# Patient Record
Sex: Female | Born: 1935 | Race: White | Hispanic: No | Marital: Married | Smoking: Never smoker
Health system: Southern US, Community
[De-identification: ages and names within clinical notes are randomized; demographics above are authoritative.]

## PROBLEM LIST (undated history)

## (undated) DIAGNOSIS — I722 Aneurysm of renal artery: Secondary | ICD-10-CM

## (undated) DIAGNOSIS — E278 Other specified disorders of adrenal gland: Secondary | ICD-10-CM

## (undated) DIAGNOSIS — I4891 Unspecified atrial fibrillation: Secondary | ICD-10-CM

## (undated) DIAGNOSIS — I701 Atherosclerosis of renal artery: Secondary | ICD-10-CM

## (undated) DIAGNOSIS — N63 Unspecified lump in unspecified breast: Secondary | ICD-10-CM

## (undated) DIAGNOSIS — C50919 Malignant neoplasm of unspecified site of unspecified female breast: Secondary | ICD-10-CM

## (undated) DIAGNOSIS — E785 Hyperlipidemia, unspecified: Secondary | ICD-10-CM

## (undated) DIAGNOSIS — I10 Essential (primary) hypertension: Secondary | ICD-10-CM

## (undated) HISTORY — DX: Unspecified lump in unspecified breast: N63.0

## (undated) HISTORY — DX: Essential (primary) hypertension: I10

## (undated) HISTORY — DX: Other specified disorders of adrenal gland: E27.8

## (undated) HISTORY — DX: Unspecified atrial fibrillation: I48.91

## (undated) HISTORY — DX: Malignant neoplasm of unspecified site of unspecified female breast: C50.919

## (undated) HISTORY — PX: CATARACT EXTRACTION: SUR2

## (undated) HISTORY — PX: BREAST SURGERY: SHX581

## (undated) HISTORY — DX: Atherosclerosis of renal artery: I70.1

## (undated) HISTORY — DX: Hyperlipidemia, unspecified: E78.5

## (undated) HISTORY — DX: Aneurysm of renal artery: I72.2

---

## 2000-05-19 ENCOUNTER — Encounter: Payer: Self-pay | Admitting: Internal Medicine

## 2000-05-19 ENCOUNTER — Ambulatory Visit (HOSPITAL_COMMUNITY): Admission: RE | Admit: 2000-05-19 | Discharge: 2000-05-19 | Payer: Self-pay | Admitting: Internal Medicine

## 2010-08-05 ENCOUNTER — Other Ambulatory Visit: Payer: Self-pay | Admitting: Radiology

## 2010-08-05 DIAGNOSIS — C50912 Malignant neoplasm of unspecified site of left female breast: Secondary | ICD-10-CM

## 2010-08-10 ENCOUNTER — Ambulatory Visit
Admission: RE | Admit: 2010-08-10 | Discharge: 2010-08-10 | Disposition: A | Discharge status: Home / Self Care | Payer: Medicare Other | Source: Ambulatory Visit | Attending: Radiology | Admitting: Radiology

## 2010-08-10 DIAGNOSIS — C50912 Malignant neoplasm of unspecified site of left female breast: Secondary | ICD-10-CM

## 2010-08-10 MED ORDER — GADOBENATE DIMEGLUMINE 529 MG/ML IV SOLN
20.0000 mL | Freq: Once | INTRAVENOUS | Status: AC | PRN
  Administered 2010-08-10: 20 mL via INTRAVENOUS

## 2010-08-13 ENCOUNTER — Ambulatory Visit (HOSPITAL_BASED_OUTPATIENT_CLINIC_OR_DEPARTMENT_OTHER): Payer: Medicare Other | Admitting: Surgery

## 2010-08-13 ENCOUNTER — Encounter (HOSPITAL_BASED_OUTPATIENT_CLINIC_OR_DEPARTMENT_OTHER): Payer: Medicare Other | Admitting: Oncology

## 2010-08-13 ENCOUNTER — Other Ambulatory Visit: Payer: Self-pay | Admitting: Oncology

## 2010-08-13 ENCOUNTER — Encounter (INDEPENDENT_AMBULATORY_CARE_PROVIDER_SITE_OTHER): Payer: Self-pay | Admitting: Surgery

## 2010-08-13 VITALS — BP 152/83 | HR 80 | Temp 98.0°F | Resp 20

## 2010-08-13 DIAGNOSIS — C50319 Malignant neoplasm of lower-inner quadrant of unspecified female breast: Secondary | ICD-10-CM

## 2010-08-13 DIAGNOSIS — C50919 Malignant neoplasm of unspecified site of unspecified female breast: Secondary | ICD-10-CM

## 2010-08-13 LAB — CBC WITH DIFFERENTIAL/PLATELET
BASO%: 0.2 % (ref 0.0–2.0)
Basophils Absolute: 0 10*3/uL (ref 0.0–0.1)
EOS%: 4.1 % (ref 0.0–7.0)
Eosinophils Absolute: 0.3 10*3/uL (ref 0.0–0.5)
HCT: 38.2 % (ref 34.8–46.6)
HGB: 13.2 g/dL (ref 11.6–15.9)
LYMPH%: 19.9 % (ref 14.0–49.7)
MCH: 31.6 pg (ref 25.1–34.0)
MCHC: 34.6 g/dL (ref 31.5–36.0)
MCV: 91.3 fL (ref 79.5–101.0)
MONO#: 0.6 10*3/uL (ref 0.1–0.9)
MONO%: 6.8 % (ref 0.0–14.0)
NEUT#: 5.8 10*3/uL (ref 1.5–6.5)
NEUT%: 69 % (ref 38.4–76.8)
Platelets: 159 10*3/uL (ref 145–400)
RBC: 4.18 10*6/uL (ref 3.70–5.45)
RDW: 12 % (ref 11.2–14.5)
WBC: 8.4 10*3/uL (ref 3.9–10.3)
lymph#: 1.7 10*3/uL (ref 0.9–3.3)

## 2010-08-13 LAB — COMPREHENSIVE METABOLIC PANEL
ALT: 15 U/L (ref 0–35)
AST: 15 U/L (ref 0–37)
Albumin: 3.8 g/dL (ref 3.5–5.2)
Alkaline Phosphatase: 53 U/L (ref 39–117)
BUN: 16 mg/dL (ref 6–23)
CO2: 35 mEq/L — ABNORMAL HIGH (ref 19–32)
Calcium: 12.6 mg/dL — ABNORMAL HIGH (ref 8.4–10.5)
Chloride: 89 mEq/L — ABNORMAL LOW (ref 96–112)
Creatinine, Ser: 1.12 mg/dL — ABNORMAL HIGH (ref 0.50–1.10)
Glucose, Bld: 265 mg/dL — ABNORMAL HIGH (ref 70–99)
Potassium: 4.5 mEq/L (ref 3.5–5.3)
Sodium: 130 mEq/L — ABNORMAL LOW (ref 135–145)
Total Bilirubin: 0.5 mg/dL (ref 0.3–1.2)
Total Protein: 6.9 g/dL (ref 6.0–8.3)

## 2010-08-13 LAB — CANCER ANTIGEN 27.29: CA 27.29: 47 U/mL — ABNORMAL HIGH (ref 0–39)

## 2010-08-13 NOTE — Progress Notes (Signed)
Subjective:     Patient ID: Mary Doyle, female   DOB: Jan 21, 1932, 77 y.o.   MRN: [id number]    BP 152/83  Pulse 80  Temp 98 F (36.7 C)  Resp 20    HPI  The patient noticed a.all mass in her left breast about 8 months ago.  It is not painful.  She has no history of breast cancer in her family.  She denies fever or chills.  Mammogram showed a 4 cm lesion in the lower inner quadrant of the left breast.  The tumor is ER+ PR+ her 2 neu -.  Biopsy confirmed left breast cancer. Past Medical History  Diagnosis Date  . Diabetes mellitus   . Hypertension   . Cancer   . Hyperparathyroidism   . Hyperlipidemia   History reviewed. No pertinent past surgical history. No Known Allergies No current outpatient prescriptions on file.   History   Social History  . Marital Status: Married    Spouse Name: N/A    Number of Children: N/A  . Years of Education: N/A   Occupational History  . Not on file.   Social History Main Topics  . Smoking status: Never Smoker   . Smokeless tobacco: Not on file  . Alcohol Use: No  . Drug Use: No  . Sexually Active: Not on file   Other Topics Concern  . Not on file   Social History Narrative  . No narrative on file   Review of Systems  Constitutional: Negative.   HENT: Negative.   Eyes: Negative.   Respiratory: Negative.   Cardiovascular: Negative.   Gastrointestinal: Negative.   Genitourinary: Negative.   Musculoskeletal: Negative.   Neurological: Negative.   Hematological: Negative.   Psychiatric/Behavioral: Negative.        Objective:   Physical Exam  Constitutional: She is oriented to person, place, and time. She appears well-developed and well-nourished.  HENT:  Head: Normocephalic and atraumatic.  Eyes: EOM are normal. Pupils are equal, round, and reactive to light.  Neck: Normal range of motion. Neck supple.  Cardiovascular: Normal rate and regular rhythm.  Exam reveals no gallop and no friction rub.   No murmur  heard. Pulmonary/Chest: Effort normal and breath sounds normal.       Left breast with 4 cm tumor in left lower inner quadrant.  No left axillary adenopathy.  Abdominal: Bowel sounds are normal.  Musculoskeletal: Normal range of motion.  Neurological: She is alert and oriented to person, place, and time.  Skin: Skin is warm and dry.  Psychiatric: She has a normal mood and affect. Her behavior is normal. Judgment and thought content normal.       Assessment:  Left breast cancer 4 cm ER+ PR+ HER 2 NEU -.  Plan:  I had a lengthy discussion with the patient and daughter about options of treatment. I discussed breast conservation vs mastectomy and sentinel lymph node mapping.  The pros and cons of each were discussed. They would like to proceed with left simple mastectomy with sentinel lymph node mapping.The surgical and non surgical options have been discussed with the patient.  Risks of surgery include bleeding,  Infection,  Flap necrosis,  Tissue loss,  Chronic pain,  Numbness,  And the need for additional procedures.  Reconstruction options also have been discussed with the patient as well.  The patient agrees to proceed.I also dicussed the risk of shoulder pain and lymphedema with the patient as well.

## 2010-08-13 NOTE — Patient Instructions (Signed)
Office will call to schedule your surgery.  [phone].

## 2010-08-18 ENCOUNTER — Other Ambulatory Visit (INDEPENDENT_AMBULATORY_CARE_PROVIDER_SITE_OTHER): Payer: Self-pay | Admitting: Surgery

## 2010-08-18 DIAGNOSIS — C50912 Malignant neoplasm of unspecified site of left female breast: Secondary | ICD-10-CM

## 2010-09-04 ENCOUNTER — Other Ambulatory Visit (INDEPENDENT_AMBULATORY_CARE_PROVIDER_SITE_OTHER): Payer: Self-pay | Admitting: Surgery

## 2010-09-04 ENCOUNTER — Ambulatory Visit (HOSPITAL_COMMUNITY)
Admission: RE | Admit: 2010-09-04 | Discharge: 2010-09-04 | Disposition: A | Discharge status: Home / Self Care | Payer: Medicare Other | Source: Ambulatory Visit | Attending: Surgery | Admitting: Surgery

## 2010-09-04 DIAGNOSIS — C50912 Malignant neoplasm of unspecified site of left female breast: Secondary | ICD-10-CM

## 2010-09-04 DIAGNOSIS — Z01812 Encounter for preprocedural laboratory examination: Secondary | ICD-10-CM | POA: Insufficient documentation

## 2010-09-04 DIAGNOSIS — Z01818 Encounter for other preprocedural examination: Secondary | ICD-10-CM | POA: Insufficient documentation

## 2010-09-04 DIAGNOSIS — Z01810 Encounter for preprocedural cardiovascular examination: Secondary | ICD-10-CM | POA: Insufficient documentation

## 2010-09-04 LAB — CANCER ANTIGEN 27.29: CA 27.29: 54 U/mL — ABNORMAL HIGH (ref 0–39)

## 2010-09-04 LAB — COMPREHENSIVE METABOLIC PANEL
AST: 16 U/L (ref 0–37)
Albumin: 4 g/dL (ref 3.5–5.2)
Chloride: 91 mEq/L — ABNORMAL LOW (ref 96–112)
Creatinine, Ser: 0.83 mg/dL (ref 0.50–1.10)
Total Bilirubin: 0.5 mg/dL (ref 0.3–1.2)

## 2010-09-04 LAB — CBC
MCV: 89.1 fL (ref 78.0–100.0)
Platelets: 187 10*3/uL (ref 150–400)
RDW: 11.9 % (ref 11.5–15.5)
WBC: 8.5 10*3/uL (ref 4.0–10.5)

## 2010-09-04 LAB — DIFFERENTIAL
Basophils Absolute: 0 10*3/uL (ref 0.0–0.1)
Eosinophils Absolute: 0.7 10*3/uL (ref 0.0–0.7)
Eosinophils Relative: 8 % — ABNORMAL HIGH (ref 0–5)
Lymphocytes Relative: 30 % (ref 12–46)
Neutrophils Relative %: 52 % (ref 43–77)

## 2010-09-04 LAB — SURGICAL PCR SCREEN: Staphylococcus aureus: NEGATIVE

## 2010-09-05 ENCOUNTER — Inpatient Hospital Stay (HOSPITAL_COMMUNITY): Admission: RE | Admit: 2010-09-05 | Payer: Medicare Other | Source: Ambulatory Visit

## 2010-09-12 ENCOUNTER — Other Ambulatory Visit (INDEPENDENT_AMBULATORY_CARE_PROVIDER_SITE_OTHER): Payer: Self-pay | Admitting: Surgery

## 2010-09-12 ENCOUNTER — Ambulatory Visit (HOSPITAL_COMMUNITY)
Admission: RE | Admit: 2010-09-12 | Discharge: 2010-09-12 | Disposition: A | Discharge status: Home / Self Care | Payer: Medicare Other | Source: Ambulatory Visit | Attending: Surgery | Admitting: Surgery

## 2010-09-12 ENCOUNTER — Ambulatory Visit (HOSPITAL_COMMUNITY)
Admission: RE | Admit: 2010-09-12 | Discharge: 2010-09-13 | Disposition: A | Discharge status: Home / Self Care | Payer: Medicare Other | Source: Ambulatory Visit | Attending: Surgery | Admitting: Surgery

## 2010-09-12 DIAGNOSIS — I10 Essential (primary) hypertension: Secondary | ICD-10-CM | POA: Insufficient documentation

## 2010-09-12 DIAGNOSIS — C50919 Malignant neoplasm of unspecified site of unspecified female breast: Secondary | ICD-10-CM

## 2010-09-12 DIAGNOSIS — Z01818 Encounter for other preprocedural examination: Secondary | ICD-10-CM | POA: Insufficient documentation

## 2010-09-12 DIAGNOSIS — Z01810 Encounter for preprocedural cardiovascular examination: Secondary | ICD-10-CM | POA: Insufficient documentation

## 2010-09-12 DIAGNOSIS — E119 Type 2 diabetes mellitus without complications: Secondary | ICD-10-CM | POA: Insufficient documentation

## 2010-09-12 DIAGNOSIS — C50912 Malignant neoplasm of unspecified site of left female breast: Secondary | ICD-10-CM

## 2010-09-12 DIAGNOSIS — M129 Arthropathy, unspecified: Secondary | ICD-10-CM | POA: Insufficient documentation

## 2010-09-12 DIAGNOSIS — E785 Hyperlipidemia, unspecified: Secondary | ICD-10-CM | POA: Insufficient documentation

## 2010-09-12 DIAGNOSIS — Z01812 Encounter for preprocedural laboratory examination: Secondary | ICD-10-CM | POA: Insufficient documentation

## 2010-09-12 LAB — GLUCOSE, CAPILLARY
Glucose-Capillary: 154 mg/dL — ABNORMAL HIGH (ref 70–99)
Glucose-Capillary: 157 mg/dL — ABNORMAL HIGH (ref 70–99)

## 2010-09-12 MED ORDER — TECHNETIUM TC 99M SULFUR COLLOID FILTERED
1.0000 | Freq: Once | INTRAVENOUS | Status: AC | PRN
  Administered 2010-09-12: 1 via INTRADERMAL

## 2010-09-13 LAB — GLUCOSE, CAPILLARY

## 2010-09-15 ENCOUNTER — Telehealth (INDEPENDENT_AMBULATORY_CARE_PROVIDER_SITE_OTHER): Payer: Self-pay | Admitting: General Surgery

## 2010-09-15 NOTE — Discharge Summary (Signed)
  NAMEEMALEA, MIX NO.:  [id number]  MEDICAL RECORD NO.:  [id number]  LOCATION:  5148                         FACILITY:  [facility]  PHYSICIAN:  Maisie Fus A. Ruslan Mccabe, M.D.DATE OF BIRTH:  Jun 04, 1931  DATE OF ADMISSION:  09/12/2010 DATE OF DISCHARGE:  09/13/2010                              DISCHARGE SUMMARY   ADMITTING DIAGNOSIS:  Left breast cancer.  DISCHARGE DIAGNOSIS:  Left breast cancer.  PROCEDURE PERFORMED:  Left simple mastectomy with sentinel lymph node mapping.  BRIEF HISTORY:  The patient is a 77 year old female with left breast cancer.  She is admitted for left simple mastectomy.  HOSPITAL COURSE:  Unremarkable.  She was discharged to home postop day #1.  Her drains were draining serous fluid.  She had no hematoma.  Wound was clean, dry, intact to her left chest.  She is tolerating her diet and had excellent pain control.  DISCHARGE INSTRUCTIONS:  She will follow up in approximately 10-14 days for drain removal.  She will continue to empty her drains daily.  She will refrain from lifting, driving, pushing, pulling for 2 weeks.  She will resume her home meds as outlined in medical reconciliation list which I have checked on.  She will go home on Vicodin 180 tablets q.4 p.r.n. for pain.  CONDITION ON DISCHARGE:  Improved.     Lemon Sternberg A. Xyon Lukasik, M.D.     TAC/MEDQ  D:  09/13/2010  T:  09/13/2010  Job:  [id number]  Electronically Signed by Harriette Bouillon M.D. on 09/15/2010 09:15:55 AM

## 2010-09-15 NOTE — Telephone Encounter (Signed)
Patient needs post op appt 1 week from last Saturday for drain check s/p mastectomy. Dr Luisa Hart only here this Wednesday or next Thursday. Does she need a nurse only appt? Please advise. Patient also needs a RX for a stool to get into daughter's car. You can call patient or pt's daughter [name] [phone].

## 2010-09-15 NOTE — Op Note (Signed)
Mary Doyle, Mary Doyle              ACCOUNT NO.:  [id number]  MEDICAL RECORD NO.:  [id number]  LOCATION:                                 FACILITY:  PHYSICIAN:  Bekim Werntz A. Leauna Sharber, M.D.DATE OF BIRTH:  1931/08/15  DATE OF PROCEDURE:  09/12/2010 DATE OF DISCHARGE:                              OPERATIVE REPORT   PREOPERATIVE DIAGNOSIS:  Left breast cancer.  POSTOPERATIVE DIAGNOSIS:  Left breast cancer.  PROCEDURES: 1. Left simple mastectomy. 2. Left axillary sentinel lymph node mapping with injection of     methylene blue dye.  SURGEON:  Maisie Fus A. Yvette Loveless, MD  ASSISTANT:  Angelia Mould. Derrell Lolling, MD  ANESTHESIA:  General anesthesia.  ESTIMATED BLOOD LOSS:  Less than 30 mL.  SPECIMENS: 1. Left breast tumor. 2. One left axillary sentinel node pathology.  DRAIN:  Two #19 round drains to left mastectomy wound.  INDICATIONS FOR PROCEDURE:  The patient is a 77 year old female with a roughly 4-cm medial left breast cancer.  She did not wish any neoadjuvant chemotherapy, wished to have it excised.  I told her that we would probably do it, but we would take a fair amount of tissue on the medial aspect and would actually come across the midline little bit to get all of the margin that we needed, but she was okay with that.  I conversed with her options of breast conservation; but given the medial nature of this tumor, its large size and even with neoadjuvant chemotherapy, I think it would be hard cosmetically to have an good outcome.  Therefore, I recommend a mastectomy, she agreed with that. Risk of bleeding, infection, flap necrosis, wound issues, wound complications, cardiovascular complications, exacerbation of underlying disease, death, DVT, pulmonary embolus, myocardial infarction, stroke, the need for further surgery, and additional therapies were discussed with the patient.  She voiced understanding of all of the above and agreed to proceed.  DESCRIPTION OF PROCEDURE:  The  patient was seen in the holding area. Left breast was marked.  Questions were answered.  Nuclear medicine injected in the left breast with technetium sulfur colloid.  She was then taken back to the operating room.  After induction of general anesthesia, the left breast was then prepped sterilely with alcohol around the nipple and 4 mL of methylene blue dye were injected in a subareolar position.  She was then prepped and draped in sterile fashion.  Time-out was done.  She received 1 g of Ancef.  The tumor was very medial and actually inferior almost on top of the sternum.  I used a marker and I marked on margin just below that and actually went below the inferior mammary fold with a mark to make sure I get adequate margin.  Curvilinear incisions were made above and below which were generous.  Superior skin flap was raised using cautery to the clavicle. I went a little bit more medial than normal in order to encompass the large tumor and actually was on the medial portion of her right breast. We then went below the inframammary fold with cautery and created the inferior skin flap.  Once we did this, we were able to carry our incisions and flaps laterally.  Once we entered the axilla, NeoProbe was used and a blue hot sentinel node was identified which is a level I axillary node.  This was removed in its entirety.  There are no other hot spots in the left axilla or evidence of any blue dye.  This was sent to pathology for evaluation.  We then completed the flap formation laterally.  We then excised the breast off the chest wall.  Where the tumor was located, it was just over the medial edge of the left pectoralis major muscle, where the fiber was inserted into the sternum. I went down and took fibers of the pectoralis major with it.  It was not fixed to the pectoralis major fascia nor the muscle.  We then took the breast off in the medial to lateral fashion.  Also, the tumor was excised of  some additional muscle tissues and those are 2 separate specimens.  The entire breast was removed, oriented, and sent to pathology.  I then irrigated out the wound and achieved hemostasis with clips and cautery.  I then took 2 separate stab incisions, placed 2 round 19 Blake drains and secured the skin with 2-0.  I then closed the wound with a deep layer of 3-0 Vicryl and 3-0 Monocryl was used to close the skin in subcuticular fashion.  Dermabond was applied.  All final counts of sponge, needle, and instruments were found to be correct in this portion of the case.  Binder was then placed.  The patient was extubated, taken to recovery in satisfactory condition after all counts are found to be correct.     Assyria Morreale A. Quintavious Rinck, M.D.     TAC/MEDQ  D:  09/12/2010  T:  09/13/2010  Job:  [id number]  cc:   Lowella Dell, M.D. Grayland Jack, M.D.  Electronically Signed by Harriette Bouillon M.D. on 09/15/2010 09:15:52 AM

## 2010-09-16 ENCOUNTER — Telehealth (INDEPENDENT_AMBULATORY_CARE_PROVIDER_SITE_OTHER): Payer: Self-pay

## 2010-09-16 NOTE — Telephone Encounter (Signed)
Pt's daughter calling requesting a callback from [name] to get pt a follow up appt next wk after surgery. [name] was notified of the call and she will call [name] back in the next .Mary Doyle

## 2010-09-17 NOTE — Telephone Encounter (Signed)
Mary Doyle will be scheduled to see dr. Luisa Hart on 09-23-10. Daughter [name] aware.

## 2010-09-23 ENCOUNTER — Ambulatory Visit (INDEPENDENT_AMBULATORY_CARE_PROVIDER_SITE_OTHER): Payer: Medicare Other | Admitting: Surgery

## 2010-09-23 ENCOUNTER — Encounter (INDEPENDENT_AMBULATORY_CARE_PROVIDER_SITE_OTHER): Payer: Self-pay | Admitting: Surgery

## 2010-09-23 DIAGNOSIS — Z9889 Other specified postprocedural states: Secondary | ICD-10-CM

## 2010-09-23 DIAGNOSIS — C50919 Malignant neoplasm of unspecified site of unspecified female breast: Secondary | ICD-10-CM

## 2010-09-23 DIAGNOSIS — C50412 Malignant neoplasm of upper-outer quadrant of left female breast: Secondary | ICD-10-CM | POA: Insufficient documentation

## 2010-09-23 NOTE — Patient Instructions (Signed)
FOLLOW UP Thursday  For drain removal.

## 2010-09-23 NOTE — Progress Notes (Signed)
The patient returns today. She is one week out from a left simple mastectomy and sentinel lymph node mapping for left breast cancer. A final tumor size was 4.3 cm and was well differentiated. He did have mucinous differentiation. She is ER/PR positive. HER-2/neu negative.  On exam: I took out one of her JP drains today. Left mastectomy incision healing well without signs of infection or seroma.  Impression: [id number] left breast cancer status post left simple mastectomy and sentinel lymph node mapping  Plan: She will return on Thursday to have her last JP drain removed. I will contact cancer center to give her an appointment to followup with her oncologist.

## 2010-09-24 LAB — PTH, INTACT AND CALCIUM

## 2010-09-25 ENCOUNTER — Other Ambulatory Visit (INDEPENDENT_AMBULATORY_CARE_PROVIDER_SITE_OTHER): Payer: Self-pay | Admitting: Surgery

## 2010-09-25 ENCOUNTER — Encounter (INDEPENDENT_AMBULATORY_CARE_PROVIDER_SITE_OTHER): Payer: Self-pay | Admitting: Surgery

## 2010-09-25 ENCOUNTER — Ambulatory Visit (INDEPENDENT_AMBULATORY_CARE_PROVIDER_SITE_OTHER): Payer: Medicare Other | Admitting: Surgery

## 2010-09-25 DIAGNOSIS — Z9889 Other specified postprocedural states: Secondary | ICD-10-CM

## 2010-09-25 NOTE — Patient Instructions (Signed)
Follow up next week for drain removal.

## 2010-09-25 NOTE — Progress Notes (Signed)
The patient returns to clinic today. She has one JP drain still in from her left simple mastectomy. A drain 50 cc since this morning. It is serous sanguinous. She does have some discomfort around the drain site.  On exam: Left mastectomy incision healing well without signs of infection. JP in place serosanguineous fluid in the bulb.  Impression: Left simple mastectomy for breast cancer [id number] ER POSITIVE PR POSITIVE.  Plan: She will return next week to have her final JP drain removed.

## 2010-09-26 LAB — PTH, INTACT AND CALCIUM
Calcium, Total (PTH): 12 mg/dL — ABNORMAL HIGH (ref 8.4–10.5)
PTH: 100 pg/mL — ABNORMAL HIGH (ref 14.0–72.0)

## 2010-09-29 ENCOUNTER — Other Ambulatory Visit: Payer: Self-pay | Admitting: Oncology

## 2010-09-29 ENCOUNTER — Encounter (HOSPITAL_BASED_OUTPATIENT_CLINIC_OR_DEPARTMENT_OTHER): Payer: Medicare Other | Admitting: Oncology

## 2010-09-29 DIAGNOSIS — C50319 Malignant neoplasm of lower-inner quadrant of unspecified female breast: Secondary | ICD-10-CM

## 2010-09-29 LAB — CBC WITH DIFFERENTIAL/PLATELET
Eosinophils Absolute: 0.2 10*3/uL (ref 0.0–0.5)
MONO#: 0.9 10*3/uL (ref 0.1–0.9)
NEUT#: 10.3 10*3/uL — ABNORMAL HIGH (ref 1.5–6.5)
RBC: 3.86 10*6/uL (ref 3.70–5.45)
RDW: 12.6 % (ref 11.2–14.5)
WBC: 13.5 10*3/uL — ABNORMAL HIGH (ref 3.9–10.3)

## 2010-09-29 LAB — COMPREHENSIVE METABOLIC PANEL
ALT: 10 U/L (ref 0–35)
Albumin: 3.6 g/dL (ref 3.5–5.2)
Alkaline Phosphatase: 51 U/L (ref 39–117)
CO2: 30 mEq/L (ref 19–32)
Glucose, Bld: 157 mg/dL — ABNORMAL HIGH (ref 70–99)
Potassium: 4.2 mEq/L (ref 3.5–5.3)
Sodium: 133 mEq/L — ABNORMAL LOW (ref 135–145)
Total Protein: 6 g/dL (ref 6.0–8.3)

## 2010-09-30 ENCOUNTER — Ambulatory Visit (INDEPENDENT_AMBULATORY_CARE_PROVIDER_SITE_OTHER): Payer: Medicare Other | Admitting: Surgery

## 2010-09-30 ENCOUNTER — Encounter (INDEPENDENT_AMBULATORY_CARE_PROVIDER_SITE_OTHER): Payer: Self-pay | Admitting: Surgery

## 2010-09-30 VITALS — BP 122/78 | HR 66 | Temp 96.5°F | Wt 216.8 lb

## 2010-09-30 DIAGNOSIS — Z9889 Other specified postprocedural states: Secondary | ICD-10-CM

## 2010-09-30 NOTE — Patient Instructions (Signed)
Wash incision with soap and water.  Take all of antibiotics.  Return in two weeks.

## 2010-09-30 NOTE — Progress Notes (Signed)
The patient returns to clinic today. She developed some erythema around her JP site. Dr. Park Breed placed her on Keflex 500 mg p.o. t.i.d. yesterday. Her drain output is less than 30 cc.  On exam: I removed her last JP drain. There is some erythema around the JP site. Mild erythema of the superior skin flap. No pus noted.  Impression: Status post left simple mastectomy for Left breast cancer. Plan: Return to clinic in 2 weeks. Take antibiotics until gone. Okay to shower. Continue range of motion exercises.

## 2010-10-01 ENCOUNTER — Other Ambulatory Visit (INDEPENDENT_AMBULATORY_CARE_PROVIDER_SITE_OTHER): Payer: Self-pay | Admitting: General Surgery

## 2010-10-02 ENCOUNTER — Telehealth (INDEPENDENT_AMBULATORY_CARE_PROVIDER_SITE_OTHER): Payer: Self-pay | Admitting: General Surgery

## 2010-10-02 NOTE — Telephone Encounter (Signed)
I NOTIFIED MS Fratto ON 10-01-10 OF DR. CORNETT'S REQ TO SCHEDULE SESTAMIBI SCAN. SCAN REQ SENT TO [facility]. [facility] HAS COPY.

## 2010-10-02 NOTE — Telephone Encounter (Signed)
Message copied by Lannie Fields on Thu Oct 02, 2010 11:35 AM ------      Message from: Harriette Bouillon A      Created: Tue Sep 30, 2010  2:53 PM       Need to reschedule

## 2010-10-10 ENCOUNTER — Telehealth (INDEPENDENT_AMBULATORY_CARE_PROVIDER_SITE_OTHER): Payer: Self-pay | Admitting: General Surgery

## 2010-10-10 NOTE — Telephone Encounter (Signed)
germaine please ck your vm..the patient called stating that she felt a lot better and did not want to keep her radiology appt for this coming Tuesday 9-11.Marland Kitchenlet9/7/12

## 2010-10-13 ENCOUNTER — Telehealth (INDEPENDENT_AMBULATORY_CARE_PROVIDER_SITE_OTHER): Payer: Self-pay | Admitting: General Surgery

## 2010-10-13 NOTE — Telephone Encounter (Signed)
I RETURNED A VOICE MAIL MESSAGE FROM Mary Doyle SENT Friday 10-10-10. MS Neuwirth SAID "SHE FEELS GREAT AND DOES NOT WANT TO HAVE HER SESTAMIBI SCAN DONE AT THIS TIME. I CANCELLED THE SESTAMIBI SCAN SCHEDULED FOR 10-14-10. DR. Darrin Luis BY PHONE

## 2010-10-14 ENCOUNTER — Encounter (HOSPITAL_COMMUNITY): Payer: Medicare Other

## 2010-10-21 ENCOUNTER — Ambulatory Visit (INDEPENDENT_AMBULATORY_CARE_PROVIDER_SITE_OTHER): Payer: Medicare Other | Admitting: Surgery

## 2010-10-21 ENCOUNTER — Encounter (INDEPENDENT_AMBULATORY_CARE_PROVIDER_SITE_OTHER): Payer: Self-pay | Admitting: Surgery

## 2010-10-21 VITALS — BP 140/80 | HR 72 | Temp 98.2°F | Resp 12 | Ht 64.0 in | Wt 213.0 lb

## 2010-10-21 DIAGNOSIS — Z9889 Other specified postprocedural states: Secondary | ICD-10-CM

## 2010-10-21 NOTE — Patient Instructions (Signed)
Follow up in three months.   

## 2010-10-21 NOTE — Progress Notes (Signed)
The patient returns to clinic today. Her left mastectomy wound is doing well. She has had cellulitis and this has resolved.  Her calcium is elevated to 12. Her PTH level was elevated as well consistent with hyperparathyroidism.  Exam: Left mastectomy wound healing well. No redness, drainage or seroma. 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 She is refusing to do this. She has no interest in treatment of this condition. I explained the potential consequences of not treating this to her and her family. She understands the risk of osteoporosis, kidney stones, mental status changes, and abdominal pain we'll talk more about this with home and let me know they wished to pursue this but currently that he did not wish to pursue any treatment testing of the condition at this point in time. Return to clinic in 3 months.

## 2010-11-05 ENCOUNTER — Encounter (HOSPITAL_BASED_OUTPATIENT_CLINIC_OR_DEPARTMENT_OTHER): Payer: Medicare Other | Admitting: Oncology

## 2010-11-05 ENCOUNTER — Other Ambulatory Visit: Payer: Self-pay | Admitting: Oncology

## 2010-11-05 DIAGNOSIS — E559 Vitamin D deficiency, unspecified: Secondary | ICD-10-CM

## 2010-11-05 DIAGNOSIS — C50319 Malignant neoplasm of lower-inner quadrant of unspecified female breast: Secondary | ICD-10-CM

## 2010-11-05 LAB — CBC WITH DIFFERENTIAL/PLATELET
BASO%: 0.4 % (ref 0.0–2.0)
EOS%: 5.6 % (ref 0.0–7.0)
HCT: 38.1 % (ref 34.8–46.6)
MCH: 31.2 pg (ref 25.1–34.0)
MCHC: 34.5 g/dL (ref 31.5–36.0)
MCV: 90.3 fL (ref 79.5–101.0)
MONO%: 7.2 % (ref 0.0–14.0)
NEUT%: 62.5 % (ref 38.4–76.8)
lymph#: 2.2 10*3/uL (ref 0.9–3.3)

## 2010-11-05 LAB — COMPREHENSIVE METABOLIC PANEL
ALT: 11 U/L (ref 0–35)
AST: 14 U/L (ref 0–37)
Alkaline Phosphatase: 45 U/L (ref 39–117)
BUN: 12 mg/dL (ref 6–23)
Calcium: 11.4 mg/dL — ABNORMAL HIGH (ref 8.4–10.5)
Chloride: 91 mEq/L — ABNORMAL LOW (ref 96–112)
Creatinine, Ser: 0.91 mg/dL (ref 0.50–1.10)
Total Bilirubin: 0.5 mg/dL (ref 0.3–1.2)

## 2011-01-06 ENCOUNTER — Telehealth: Payer: Self-pay | Admitting: *Deleted

## 2011-01-06 NOTE — Telephone Encounter (Signed)
patient confirmed over the phone the new date and time 03-09-2011 starting at 9:30am

## 2011-02-26 ENCOUNTER — Ambulatory Visit (INDEPENDENT_AMBULATORY_CARE_PROVIDER_SITE_OTHER): Payer: Medicare Other | Admitting: Surgery

## 2011-02-26 ENCOUNTER — Encounter (INDEPENDENT_AMBULATORY_CARE_PROVIDER_SITE_OTHER): Payer: Self-pay | Admitting: Surgery

## 2011-02-26 VITALS — BP 118/84 | HR 68 | Temp 97.0°F | Ht 64.0 in | Wt 213.4 lb

## 2011-02-26 DIAGNOSIS — Z853 Personal history of malignant neoplasm of breast: Secondary | ICD-10-CM

## 2011-02-26 NOTE — Progress Notes (Signed)
The patient returns to clinic today. Her left mastectomy wound is doing well. She has had cellulitis and this has resolved.  Her calcium is elevated to 12. Her PTH level was elevated as well consistent with hyperparathyroidism. She has no new complaints She feels great.    ROS: pulmonary  No SOB NO CHEST Pain   Abdomen  No nausea no vomiting   Chest:  No pain  No mass Exam: Left mastectomy wound healing well. 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 She is refusing to do this. She has no interest in treatment of this condition. I explained the potential consequences of not treating this to her and her family. She understands the risk of osteoporosis, kidney stones, mental status changes, and abdominal pain we'll talk more about this with home and let me know they wished to pursue this but currently that he did not wish to pursue any treatment testing of the condition at this point in time. Return to clinic in 6 months. She has no desire to treat primary hyperparathyroidism.

## 2011-02-26 NOTE — Patient Instructions (Signed)
Follow-up 6 months

## 2011-03-09 ENCOUNTER — Other Ambulatory Visit: Payer: Medicare Other | Admitting: Lab

## 2011-03-09 ENCOUNTER — Ambulatory Visit: Payer: Medicare Other | Admitting: Oncology

## 2011-03-19 ENCOUNTER — Other Ambulatory Visit: Payer: Medicare Other | Admitting: Lab

## 2011-03-19 ENCOUNTER — Encounter: Payer: Self-pay | Admitting: Oncology

## 2011-03-19 ENCOUNTER — Ambulatory Visit (HOSPITAL_BASED_OUTPATIENT_CLINIC_OR_DEPARTMENT_OTHER): Payer: Medicare Other | Admitting: Oncology

## 2011-03-19 ENCOUNTER — Telehealth: Payer: Self-pay | Admitting: *Deleted

## 2011-03-19 VITALS — BP 183/67 | HR 66 | Temp 97.4°F | Ht 64.0 in | Wt 214.2 lb

## 2011-03-19 DIAGNOSIS — E213 Hyperparathyroidism, unspecified: Secondary | ICD-10-CM

## 2011-03-19 DIAGNOSIS — C50919 Malignant neoplasm of unspecified site of unspecified female breast: Secondary | ICD-10-CM

## 2011-03-19 LAB — CBC WITH DIFFERENTIAL/PLATELET
BASO%: 0.7 % (ref 0.0–2.0)
Basophils Absolute: 0.1 10*3/uL (ref 0.0–0.1)
EOS%: 6.6 % (ref 0.0–7.0)
HCT: 38.4 % (ref 34.8–46.6)
HGB: 13 g/dL (ref 11.6–15.9)
LYMPH%: 26 % (ref 14.0–49.7)
MCH: 30.8 pg (ref 25.1–34.0)
MCHC: 33.9 g/dL (ref 31.5–36.0)
MCV: 91 fL (ref 79.5–101.0)
NEUT%: 59.1 % (ref 38.4–76.8)
Platelets: 172 10*3/uL (ref 145–400)

## 2011-03-19 NOTE — Telephone Encounter (Signed)
gave patient appointment for 09-2011 printed out calendar and gave to the patient 

## 2011-03-19 NOTE — Progress Notes (Signed)
OFFICE PROGRESS NOTE  CC Dr. Harriette Bouillon Hoyle Sauer, MD, MD [address] [facility], [place]. [place] [address]  DIAGNOSIS: 78 year old with 4.3 cm left breast cancer, ER+ s/p left breast mastectomy  PRIOR THERAPY: 1. S/p left breast mastectomy with SNL , final path showed a well-differentiated IDC measuring 4.3 cm 2. Now on arimidex 1 mg po daily  CURRENT THERAPY:arimidex 1 mg daily since December 2012  INTERVAL HISTORY: Mary Doyle 78 y.o. female returns for follow up visit overall doing , tolerating the oral arimidex well, no fevers chills nausea or vomiting. No myalgias or arthralgias. Remainder of the 10 point review of systems is negative. She was found to have elevated calcium but she does not want to pursue further work up.  MEDICAL HISTORY: Past Medical History  Diagnosis Date  . Diabetes mellitus   . Hypertension   . Cancer   . Hyperparathyroidism   . Hyperlipidemia   . Glaucoma   . Lump in female breast     ALLERGIES:   has no known allergies.  MEDICATIONS:  Current Outpatient Prescriptions  Medication Sig Dispense Refill  . alendronate (FOSAMAX) 70 MG tablet       . amLODipine (NORVASC) 5 MG tablet       . anastrozole (ARIMIDEX) 1 MG tablet Take 1 mg by mouth daily.      . cephALEXin (KEFLEX) 500 MG capsule Take 500 mg by mouth 3 (three) times daily.        . fluorometholone (FML) 0.1 % ophthalmic suspension       . fluticasone (FLONASE) 50 MCG/ACT nasal spray       . furosemide (LASIX) 20 MG tablet       . GLIPIZIDE XL 5 MG 24 hr tablet       . hydrochlorothiazide 25 MG tablet       . hydrOXYzine (ATARAX) 25 MG tablet       . latanoprost (XALATAN) 0.005 % ophthalmic solution       . losartan (COZAAR) 50 MG tablet       . metFORMIN (GLUCOPHAGE) 1000 MG tablet       . metoprolol (TOPROL-XL) 200 MG 24 hr tablet       . ONE TOUCH ULTRA TEST test strip       . promethazine (PHENERGAN) 25 MG tablet       . ranitidine  (ZANTAC) 150 MG tablet       . simvastatin (ZOCOR) 80 MG tablet         SURGICAL HISTORY:  Past Surgical History  Procedure Date  . Breast surgery     masectomy - left    REVIEW OF SYSTEMS:  Pertinent items are noted in HPI.   PHYSICAL EXAMINATION: General appearance: alert, cooperative and appears stated age Lymph nodes: Cervical, supraclavicular, and axillary nodes normal. Resp: clear to auscultation bilaterally and normal percussion bilaterally Back: symmetric, no curvature. ROM normal. No CVA tenderness. Cardio: regular rate and rhythm, S1, S2 normal, no murmur, click, rub or gallop GI: soft, non-tender; bowel sounds normal; no masses,  no organomegaly Extremities: extremities normal, atraumatic, no cyanosis or edema Neurologic: Alert and oriented X 3, normal strength and tone. Normal symmetric reflexes. Normal coordination and gait Breast: right breast no masses or neipple discharge, left mastectomy site looks good no skin changes  ECOG PERFORMANCE STATUS: 1 - Symptomatic but completely ambulatory  Blood pressure 183/67, pulse 66, temperature 97.4 F (36.3 C), temperature source Oral, height  5\' 4"  (1.626 m), weight 214 lb 3.2 oz (97.16 kg).  LABORATORY DATA: Lab Results  Component Value Date   WBC 8.0 03/19/2011   HGB 13.0 03/19/2011   HCT 38.4 03/19/2011   MCV 91.0 03/19/2011   PLT 172 03/19/2011      Chemistry      Component Value Date/Time   NA 132* 11/05/2010 1233   NA 132* 11/05/2010 1233   K 4.2 11/05/2010 1233   K 4.2 11/05/2010 1233   CL 91* 11/05/2010 1233   CL 91* 11/05/2010 1233   CO2 30 11/05/2010 1233   CO2 30 11/05/2010 1233   BUN 12 11/05/2010 1233   BUN 12 11/05/2010 1233   CREATININE 0.91 11/05/2010 1233   CREATININE 0.91 11/05/2010 1233      Component Value Date/Time   CALCIUM 11.4* 11/05/2010 1233   CALCIUM 11.4* 11/05/2010 1233   CALCIUM 12.0* 09/25/2010 0000   ALKPHOS 45 11/05/2010 1233   ALKPHOS 45 11/05/2010 1233   AST 14 11/05/2010 1233   AST 14  11/05/2010 1233   ALT 11 11/05/2010 1233   ALT 11 11/05/2010 1233   BILITOT 0.5 11/05/2010 1233   BILITOT 0.5 11/05/2010 1233       RADIOGRAPHIC STUDIES:  No results found.  ASSESSMENT: 78 year old female with :  1. Stage II node negative breast cancer s/p mastectomy now on arimidex and doing well 2. Hyperparathyroidism follow up with PCP   PLAN:  1. Continue arimidex  2. See me back in 6 months in follow up   All questions were answered. The patient knows to call the clinic with any problems, questions or concerns. We can certainly see the patient much sooner if necessary.  I spent 20 minutes counseling the patient face to face. The total time spent in the appointment was 30 minutes.    Drue Second, MD Medical/Oncology [facility] [phone] (beeper) [phone] (Office)  03/19/2011, 12:20 PM

## 2011-03-19 NOTE — Patient Instructions (Signed)
You are doing well.  If you start having increasing joint pain or swelling please call the office and speak to [facility] my nurse.  If you need to have your arimidex prescription refilled please call your pharmacy and they will call us. Please check you rmediactions well advance to get them refilled.   I will see you back in 6 months with blood work as well.  Have a great day! Dr. Welton Flakes

## 2011-03-20 LAB — COMPREHENSIVE METABOLIC PANEL
ALT: 13 U/L (ref 0–35)
AST: 16 U/L (ref 0–37)
BUN: 16 mg/dL (ref 6–23)
Calcium: 11.4 mg/dL — ABNORMAL HIGH (ref 8.4–10.5)
Chloride: 91 mEq/L — ABNORMAL LOW (ref 96–112)
Creatinine, Ser: 0.99 mg/dL (ref 0.50–1.10)
Total Bilirubin: 0.6 mg/dL (ref 0.3–1.2)

## 2011-03-20 LAB — VITAMIN D 25 HYDROXY (VIT D DEFICIENCY, FRACTURES): Vit D, 25-Hydroxy: 34 ng/mL (ref 30–89)

## 2011-03-23 ENCOUNTER — Telehealth: Payer: Self-pay | Admitting: *Deleted

## 2011-03-23 NOTE — Telephone Encounter (Signed)
Per MD t to take Vitamin D 1000 iu daily. P[t adivsed she has a Rx for Vitamin D 50,000 units to take 1 weekly. Reviewed with MD. Per MD pt to continue the 50,000 units weekly. Pt verbalized understanding.

## 2011-03-23 NOTE — Telephone Encounter (Signed)
Per MD called [facility] for pt start Vitamin D3 1000 uints daily. Pt verbalized understanding

## 2011-09-01 ENCOUNTER — Encounter (INDEPENDENT_AMBULATORY_CARE_PROVIDER_SITE_OTHER): Payer: Self-pay | Admitting: Surgery

## 2011-09-01 ENCOUNTER — Ambulatory Visit (INDEPENDENT_AMBULATORY_CARE_PROVIDER_SITE_OTHER): Payer: Medicare Other | Admitting: Surgery

## 2011-09-01 VITALS — BP 126/82 | HR 64 | Temp 97.3°F | Resp 12 | Ht 63.0 in | Wt 217.8 lb

## 2011-09-01 DIAGNOSIS — Z853 Personal history of malignant neoplasm of breast: Secondary | ICD-10-CM

## 2011-09-01 NOTE — Progress Notes (Signed)
The patient returns to clinic today. Her left mastectomy wound is doing well. She has had cellulitis and this has resolved.  Her calcium is elevated to 12. Her PTH level was elevated as well consistent with hyperparathyroidism. She has no new complaints She feels great.    ROS: pulmonary  No SOB NO CHEST Pain   Abdomen  No nausea no vomiting   Chest:  No pain  No mass Exam: Left mastectomy wound healing well. 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 She is refusing to do this. She has no interest in treatment of this condition. I explained the potential consequences of not treating this to her and her family. She understands the risk of osteoporosis, kidney stones, mental status changes, and abdominal pain we'll talk more about this with home and let me know they wished to pursue this but currently that he did not wish to pursue any treatment testing of the condition at this point in time. Return to clinic in 6 months. She has no desire to treat primary hyperparathyroidism.  Stable from breast cancer standpoint.

## 2011-09-01 NOTE — Patient Instructions (Signed)
Return 1 year.  Obtain mammogram

## 2011-09-17 ENCOUNTER — Encounter: Payer: Self-pay | Admitting: Oncology

## 2011-09-17 ENCOUNTER — Telehealth: Payer: Self-pay | Admitting: *Deleted

## 2011-09-17 ENCOUNTER — Other Ambulatory Visit (HOSPITAL_BASED_OUTPATIENT_CLINIC_OR_DEPARTMENT_OTHER): Payer: Medicare Other

## 2011-09-17 ENCOUNTER — Ambulatory Visit (HOSPITAL_BASED_OUTPATIENT_CLINIC_OR_DEPARTMENT_OTHER): Payer: Medicare Other | Admitting: Oncology

## 2011-09-17 VITALS — BP 118/69 | HR 81 | Temp 97.1°F | Resp 20 | Ht 63.0 in | Wt 214.3 lb

## 2011-09-17 DIAGNOSIS — C50319 Malignant neoplasm of lower-inner quadrant of unspecified female breast: Secondary | ICD-10-CM

## 2011-09-17 DIAGNOSIS — C50919 Malignant neoplasm of unspecified site of unspecified female breast: Secondary | ICD-10-CM

## 2011-09-17 LAB — COMPREHENSIVE METABOLIC PANEL
Albumin: 4.1 g/dL (ref 3.5–5.2)
BUN: 18 mg/dL (ref 6–23)
CO2: 32 mEq/L (ref 19–32)
Calcium: 10.8 mg/dL — ABNORMAL HIGH (ref 8.4–10.5)
Glucose, Bld: 184 mg/dL — ABNORMAL HIGH (ref 70–99)
Potassium: 4.9 mEq/L (ref 3.5–5.3)
Sodium: 129 mEq/L — ABNORMAL LOW (ref 135–145)
Total Protein: 6.3 g/dL (ref 6.0–8.3)

## 2011-09-17 LAB — CBC WITH DIFFERENTIAL/PLATELET
Basophils Absolute: 0 10*3/uL (ref 0.0–0.1)
Eosinophils Absolute: 0.3 10*3/uL (ref 0.0–0.5)
HCT: 37.9 % (ref 34.8–46.6)
HGB: 13.4 g/dL (ref 11.6–15.9)
MCH: 30.9 pg (ref 25.1–34.0)
MCV: 87.3 fL (ref 79.5–101.0)
NEUT#: 4.2 10*3/uL (ref 1.5–6.5)
NEUT%: 56.9 % (ref 38.4–76.8)
RDW: 12.3 % (ref 11.2–14.5)
lymph#: 2.2 10*3/uL (ref 0.9–3.3)

## 2011-09-17 NOTE — Progress Notes (Signed)
OFFICE PROGRESS NOTE  CC Dr. Harriette Bouillon Hoyle Sauer, MD [address] [facility], [place]. [place] [address]  DIAGNOSIS: 78 year old with 4.3 cm left breast cancer, ER+ s/p left breast mastectomy  PRIOR THERAPY: 1. S/p left breast mastectomy with SNL , final path showed a well-differentiated IDC measuring 4.3 cm 2. Now on arimidex 1 mg po daily  CURRENT THERAPY:arimidex 1 mg daily since December 2012  INTERVAL HISTORY: Mary Doyle 78 y.o. female returns for follow up visit overall doing , tolerating the oral arimidex well, no fevers chills nausea or vomiting. No myalgias or arthralgias. Remainder of the 10 point review of systems is negative. She was found to have elevated calcium but she does not want to pursue further work up.  MEDICAL HISTORY: Past Medical History  Diagnosis Date  . Diabetes mellitus   . Hypertension   . Cancer   . Hyperparathyroidism   . Hyperlipidemia   . Glaucoma   . Lump in female breast     ALLERGIES:   has no known allergies.  MEDICATIONS:  Current Outpatient Prescriptions  Medication Sig Dispense Refill  . alendronate (FOSAMAX) 70 MG tablet       . amLODipine (NORVASC) 5 MG tablet       . anastrozole (ARIMIDEX) 1 MG tablet Take 1 mg by mouth daily.      . cephALEXin (KEFLEX) 500 MG capsule Take 500 mg by mouth 3 (three) times daily.        . fluorometholone (FML) 0.1 % ophthalmic suspension       . fluticasone (FLONASE) 50 MCG/ACT nasal spray       . furosemide (LASIX) 20 MG tablet       . GLIPIZIDE XL 5 MG 24 hr tablet       . hydrochlorothiazide 25 MG tablet       . hydrOXYzine (ATARAX) 25 MG tablet       . latanoprost (XALATAN) 0.005 % ophthalmic solution       . losartan (COZAAR) 50 MG tablet       . metFORMIN (GLUCOPHAGE) 1000 MG tablet       . metoprolol (TOPROL-XL) 200 MG 24 hr tablet       . ONE TOUCH ULTRA TEST test strip       . promethazine (PHENERGAN) 25 MG tablet       . ranitidine (ZANTAC)  150 MG tablet       . simvastatin (ZOCOR) 80 MG tablet         SURGICAL HISTORY:  Past Surgical History  Procedure Date  . Breast surgery     masectomy - left    REVIEW OF SYSTEMS:  Pertinent items are noted in HPI.   PHYSICAL EXAMINATION: General appearance: alert, cooperative and appears stated age Lymph nodes: Cervical, supraclavicular, and axillary nodes normal. Resp: clear to auscultation bilaterally and normal percussion bilaterally Back: symmetric, no curvature. ROM normal. No CVA tenderness. Cardio: regular rate and rhythm, S1, S2 normal, no murmur, click, rub or gallop GI: soft, non-tender; bowel sounds normal; no masses,  no organomegaly Extremities: extremities normal, atraumatic, no cyanosis or edema Neurologic: Alert and oriented X 3, normal strength and tone. Normal symmetric reflexes. Normal coordination and gait Breast: right breast no masses or neipple discharge, left mastectomy site looks good no skin changes  ECOG PERFORMANCE STATUS: 1 - Symptomatic but completely ambulatory  Blood pressure 118/69, pulse 81, temperature 97.1 F (36.2 C), resp. rate 20, height 5'  3" (1.6 m), weight 214 lb 4.8 oz (97.206 kg).  LABORATORY DATA: Lab Results  Component Value Date   WBC 7.4 09/17/2011   HGB 13.4 09/17/2011   HCT 37.9 09/17/2011   MCV 87.3 09/17/2011   PLT 189 09/17/2011      Chemistry      Component Value Date/Time   NA 133* 03/19/2011 1123   K 4.4 03/19/2011 1123   CL 91* 03/19/2011 1123   CO2 31 03/19/2011 1123   BUN 16 03/19/2011 1123   CREATININE 0.99 03/19/2011 1123      Component Value Date/Time   CALCIUM 11.4* 03/19/2011 1123   CALCIUM 12.0* 09/25/2010 0000   ALKPHOS 52 03/19/2011 1123   AST 16 03/19/2011 1123   ALT 13 03/19/2011 1123   BILITOT 0.6 03/19/2011 1123       RADIOGRAPHIC STUDIES:  No results found.  ASSESSMENT: 77 year old female with :  1. 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  Continue arimidex  2. See me back in 6 months in follow up   All questions were answered. The patient knows to call the clinic with any problems, questions or concerns. We can certainly see the patient much sooner if necessary.  I spent 20 minutes counseling the patient face to face. The total time spent in the appointment was 30 minutes.    Drue Second, MD Medical/Oncology [facility] [phone] (beeper) [phone] (Office)  09/17/2011, 12:32 PM

## 2011-09-17 NOTE — Telephone Encounter (Signed)
MADE PATIENT APPOINTMENT FOR 02-2012 STARTING AT 11:00AM WITH LABS

## 2011-09-17 NOTE — Patient Instructions (Addendum)
Continue Arimidex daily  I will see you back in January 2014

## 2011-09-18 ENCOUNTER — Telehealth: Payer: Self-pay | Admitting: *Deleted

## 2011-09-18 NOTE — Telephone Encounter (Signed)
Message copied by Cooper Render on Fri Sep 18, 2011  6:23 PM ------      Message from: Victorino December      Created: Fri Sep 18, 2011  9:15 AM       Call patient: have patient increase salt intake as sodium low

## 2011-09-18 NOTE — Telephone Encounter (Signed)
Per MD,notified pt to increase salt intake as sodium low. Pt states ' I have some salted peanuts here I could eat those" Dicussed with pt salted peanuts would help increase salt intake. To eat in moderation as too many could lead to constipation and abdominal cramps. Pt states " okay, I'm only going to eat a hand full or so, everyday or maybe add an extra shake of salt with my cooking." Pt to call with concerns. Keep f/u appts as scheduled. Pt verbalized understanding.

## 2011-10-19 ENCOUNTER — Encounter (INDEPENDENT_AMBULATORY_CARE_PROVIDER_SITE_OTHER): Payer: Self-pay

## 2012-02-17 ENCOUNTER — Telehealth: Payer: Self-pay | Admitting: *Deleted

## 2012-02-17 NOTE — Telephone Encounter (Signed)
Patient called and cancelled appointment for 02-18-2012 will call back at a later date

## 2012-02-18 ENCOUNTER — Ambulatory Visit: Payer: Medicare Other | Admitting: Oncology

## 2012-02-18 ENCOUNTER — Other Ambulatory Visit: Payer: Medicare Other | Admitting: Lab

## 2012-04-01 ENCOUNTER — Telehealth: Payer: Self-pay | Admitting: Oncology

## 2012-04-01 NOTE — Telephone Encounter (Signed)
S/w pt re appt for 3/11.

## 2012-04-12 ENCOUNTER — Other Ambulatory Visit (HOSPITAL_BASED_OUTPATIENT_CLINIC_OR_DEPARTMENT_OTHER): Payer: Medicare Other | Admitting: Lab

## 2012-04-12 ENCOUNTER — Telehealth: Payer: Self-pay | Admitting: Oncology

## 2012-04-12 ENCOUNTER — Encounter: Payer: Self-pay | Admitting: Oncology

## 2012-04-12 ENCOUNTER — Ambulatory Visit (HOSPITAL_BASED_OUTPATIENT_CLINIC_OR_DEPARTMENT_OTHER): Payer: Medicare Other | Admitting: Oncology

## 2012-04-12 VITALS — BP 123/79 | HR 91 | Temp 97.7°F | Resp 20 | Ht 63.0 in | Wt 207.1 lb

## 2012-04-12 DIAGNOSIS — C50319 Malignant neoplasm of lower-inner quadrant of unspecified female breast: Secondary | ICD-10-CM

## 2012-04-12 DIAGNOSIS — C50919 Malignant neoplasm of unspecified site of unspecified female breast: Secondary | ICD-10-CM

## 2012-04-12 DIAGNOSIS — Z170 Estrogen receptor positive status [ER+]: Secondary | ICD-10-CM

## 2012-04-12 LAB — CBC WITH DIFFERENTIAL/PLATELET
BASO%: 0.8 % (ref 0.0–2.0)
EOS%: 3.1 % (ref 0.0–7.0)
MCH: 30.9 pg (ref 25.1–34.0)
MCHC: 34.1 g/dL (ref 31.5–36.0)
MCV: 90.5 fL (ref 79.5–101.0)
MONO%: 8.4 % (ref 0.0–14.0)
RBC: 4.16 10*6/uL (ref 3.70–5.45)
RDW: 12.9 % (ref 11.2–14.5)
lymph#: 1.9 10*3/uL (ref 0.9–3.3)

## 2012-04-12 LAB — COMPREHENSIVE METABOLIC PANEL (CC13)
ALT: 11 U/L (ref 0–55)
AST: 13 U/L (ref 5–34)
Albumin: 3.6 g/dL (ref 3.5–5.0)
Alkaline Phosphatase: 52 U/L (ref 40–150)
Calcium: 11.9 mg/dL — ABNORMAL HIGH (ref 8.4–10.4)
Chloride: 94 mEq/L — ABNORMAL LOW (ref 98–107)
Potassium: 3.9 mEq/L (ref 3.5–5.1)

## 2012-04-12 NOTE — Patient Instructions (Addendum)
Continue arimidex today  I will see you back in 1 year

## 2012-04-12 NOTE — Telephone Encounter (Signed)
gv dtr appt schedule for March 2015.

## 2012-04-24 NOTE — Progress Notes (Signed)
OFFICE PROGRESS NOTE  CC Dr. Harriette Bouillon Hoyle Sauer, MD [address] [facility], [place]. [place] [address]  DIAGNOSIS: 79 year old with 4.3 cm left breast cancer, ER+ s/p left breast mastectomy  PRIOR THERAPY: 1. S/p left breast mastectomy with SNL , final path showed a well-differentiated IDC measuring 4.3 cm 2. Now on arimidex 1 mg po daily  CURRENT THERAPY:arimidex 1 mg daily since December 2012  INTERVAL HISTORY: Mary Doyle 79 y.o. female returns for follow up visit overall doing , tolerating the oral arimidex well, no fevers chills nausea or vomiting. No myalgias or arthralgias. Remainder of the 10 point review of systems is negative.   MEDICAL HISTORY: Past Medical History  Diagnosis Date  . Diabetes mellitus   . Hypertension   . Cancer   . Hyperparathyroidism   . Hyperlipidemia   . Glaucoma(365)   . Lump in female breast     ALLERGIES:  has No Known Allergies.  MEDICATIONS:  Current Outpatient Prescriptions  Medication Sig Dispense Refill  . alendronate (FOSAMAX) 70 MG tablet       . amLODipine (NORVASC) 5 MG tablet       . anastrozole (ARIMIDEX) 1 MG tablet Take 1 mg by mouth daily.      . fluorometholone (FML) 0.1 % ophthalmic suspension       . fluticasone (FLONASE) 50 MCG/ACT nasal spray       . furosemide (LASIX) 20 MG tablet       . GLIPIZIDE XL 5 MG 24 hr tablet       . hydrochlorothiazide 25 MG tablet       . hydrOXYzine (ATARAX) 25 MG tablet       . latanoprost (XALATAN) 0.005 % ophthalmic solution       . losartan (COZAAR) 50 MG tablet       . metFORMIN (GLUCOPHAGE) 1000 MG tablet       . metoprolol (TOPROL-XL) 200 MG 24 hr tablet       . ONE TOUCH ULTRA TEST test strip       . promethazine (PHENERGAN) 25 MG tablet       . ranitidine (ZANTAC) 150 MG tablet       . simvastatin (ZOCOR) 80 MG tablet        No current facility-administered medications for this visit.    SURGICAL HISTORY:  Past Surgical  History  Procedure Laterality Date  . Breast surgery      masectomy - left    REVIEW OF SYSTEMS:  Pertinent items are noted in HPI.   PHYSICAL EXAMINATION: General appearance: alert, cooperative and appears stated age Lymph nodes: Cervical, supraclavicular, and axillary nodes normal. Resp: clear to auscultation bilaterally and normal percussion bilaterally Back: symmetric, no curvature. ROM normal. No CVA tenderness. Cardio: regular rate and rhythm, S1, S2 normal, no murmur, click, rub or gallop GI: soft, non-tender; bowel sounds normal; no masses,  no organomegaly Extremities: extremities normal, atraumatic, no cyanosis or edema Neurologic: Alert and oriented X 3, normal strength and tone. Normal symmetric reflexes. Normal coordination and gait Breast: right breast no masses or neipple discharge, left mastectomy site looks good no skin changes  ECOG PERFORMANCE STATUS: 1 - Symptomatic but completely ambulatory  Blood pressure 123/79, pulse 91, temperature 97.7 F (36.5 C), temperature source Oral, resp. rate 20, height 5\' 3"  (1.6 m), weight 207 lb 1.6 oz (93.94 kg).  LABORATORY DATA: Lab Results  Component Value Date   WBC 8.2 04/12/2012  HGB 12.8 04/12/2012   HCT 37.6 04/12/2012   MCV 90.5 04/12/2012   PLT 174 04/12/2012      Chemistry      Component Value Date/Time   NA 134* 04/12/2012 1417   NA 129* 09/17/2011 1145   K 3.9 04/12/2012 1417   K 4.9 09/17/2011 1145   CL 94* 04/12/2012 1417   CL 90* 09/17/2011 1145   CO2 31* 04/12/2012 1417   CO2 32 09/17/2011 1145   BUN 16.9 04/12/2012 1417   BUN 18 09/17/2011 1145   CREATININE 1.1 04/12/2012 1417   CREATININE 0.94 09/17/2011 1145      Component Value Date/Time   CALCIUM 11.9* 04/12/2012 1417   CALCIUM 10.8* 09/17/2011 1145   CALCIUM 12.0* 09/25/2010 0000   ALKPHOS 52 04/12/2012 1417   ALKPHOS 38* 09/17/2011 1145   AST 13 04/12/2012 1417   AST 17 09/17/2011 1145   ALT 11 04/12/2012 1417   ALT 14 09/17/2011 1145   BILITOT 0.53  04/12/2012 1417   BILITOT 0.8 09/17/2011 1145       RADIOGRAPHIC STUDIES:  No results found.  ASSESSMENT: 79 year old female with :  1. 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 Continue arimidex  2. See me back in 6 months in follow up   All questions were answered. The patient knows to call the clinic with any problems, questions or concerns. We can certainly see the patient much sooner if necessary.  I spent 20 minutes counseling the patient face to face. The total time spent in the appointment was 30 minutes.    Drue Second, MD Medical/Oncology [facility] [phone] (beeper) [phone] (Office)

## 2012-07-01 ENCOUNTER — Encounter (INDEPENDENT_AMBULATORY_CARE_PROVIDER_SITE_OTHER): Payer: Self-pay | Admitting: Surgery

## 2012-11-24 IMAGING — CR DG CHEST 2V
2 series · 2 of 2 positions shown · non-contrast
Comparison: None.

CLINICAL DATA: Preoperative radiograph.  Hypertension.  History of
left mastectomy.  Cough.

CHEST - 2 VIEW

[view not recorded (1 of 2)]
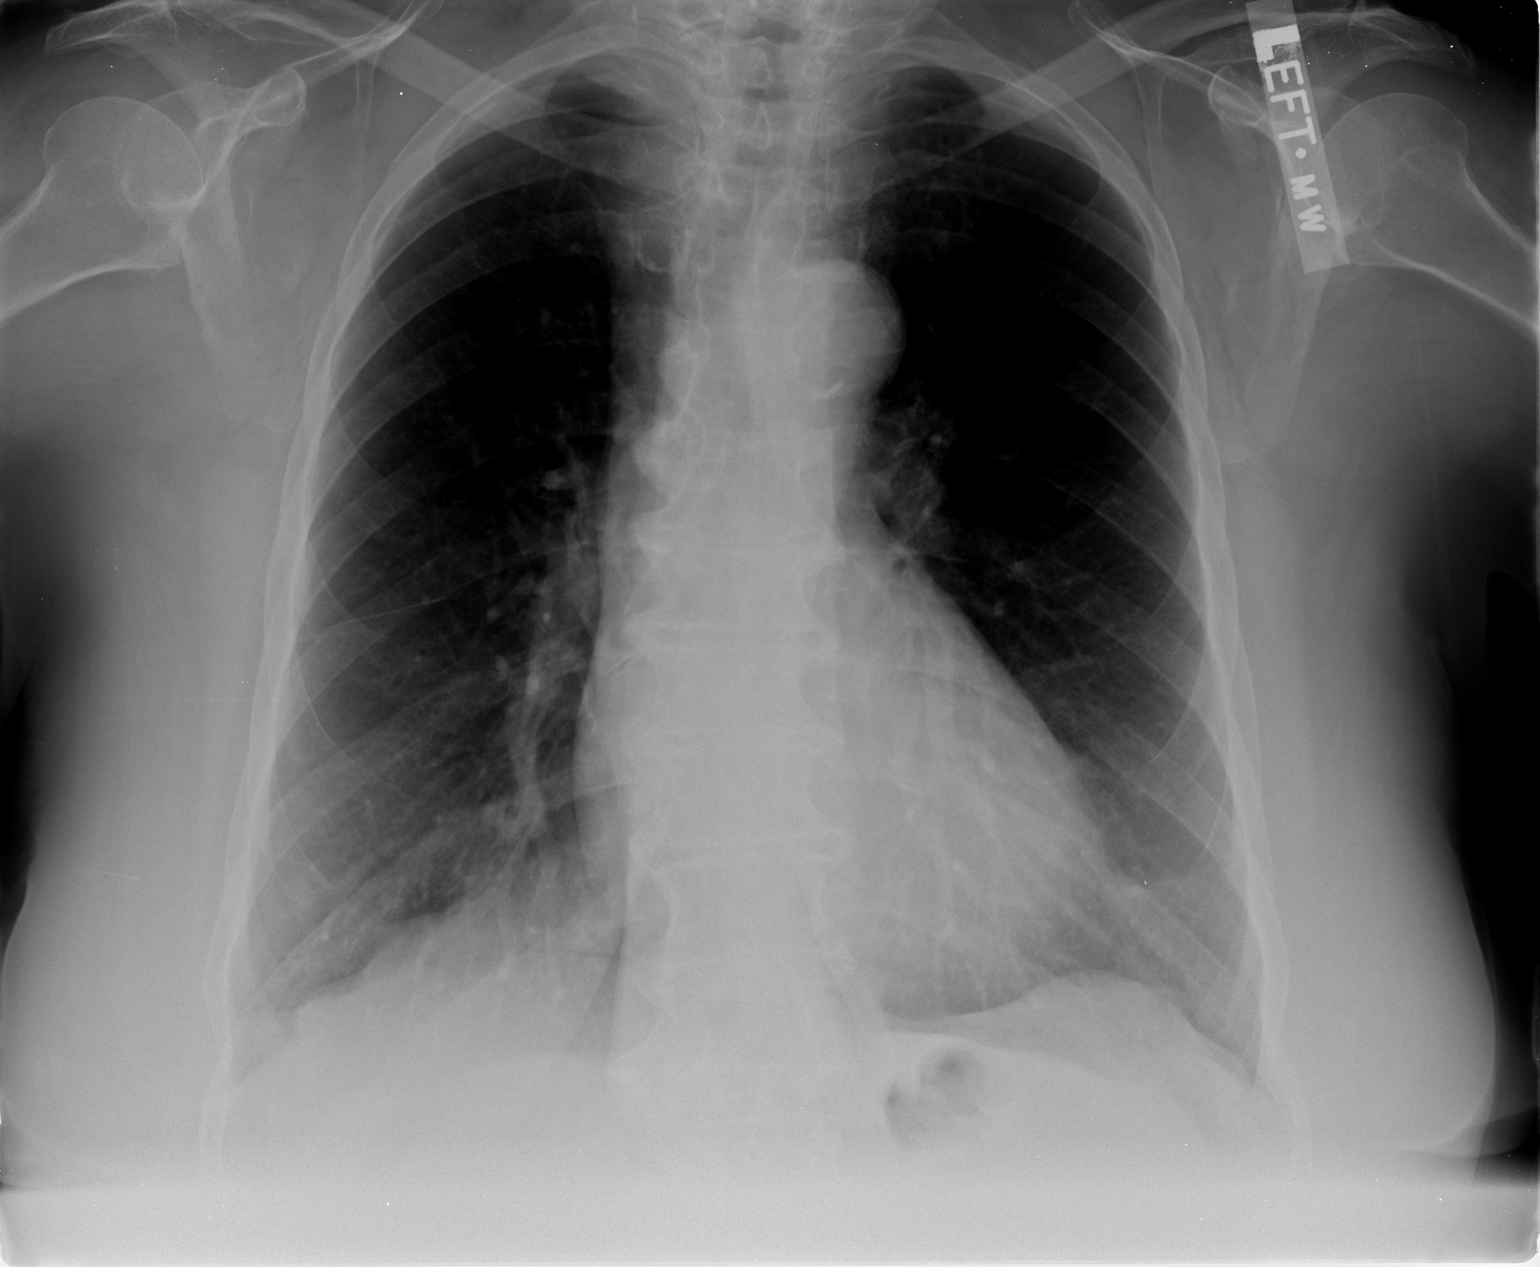

[view not recorded (2 of 2)]
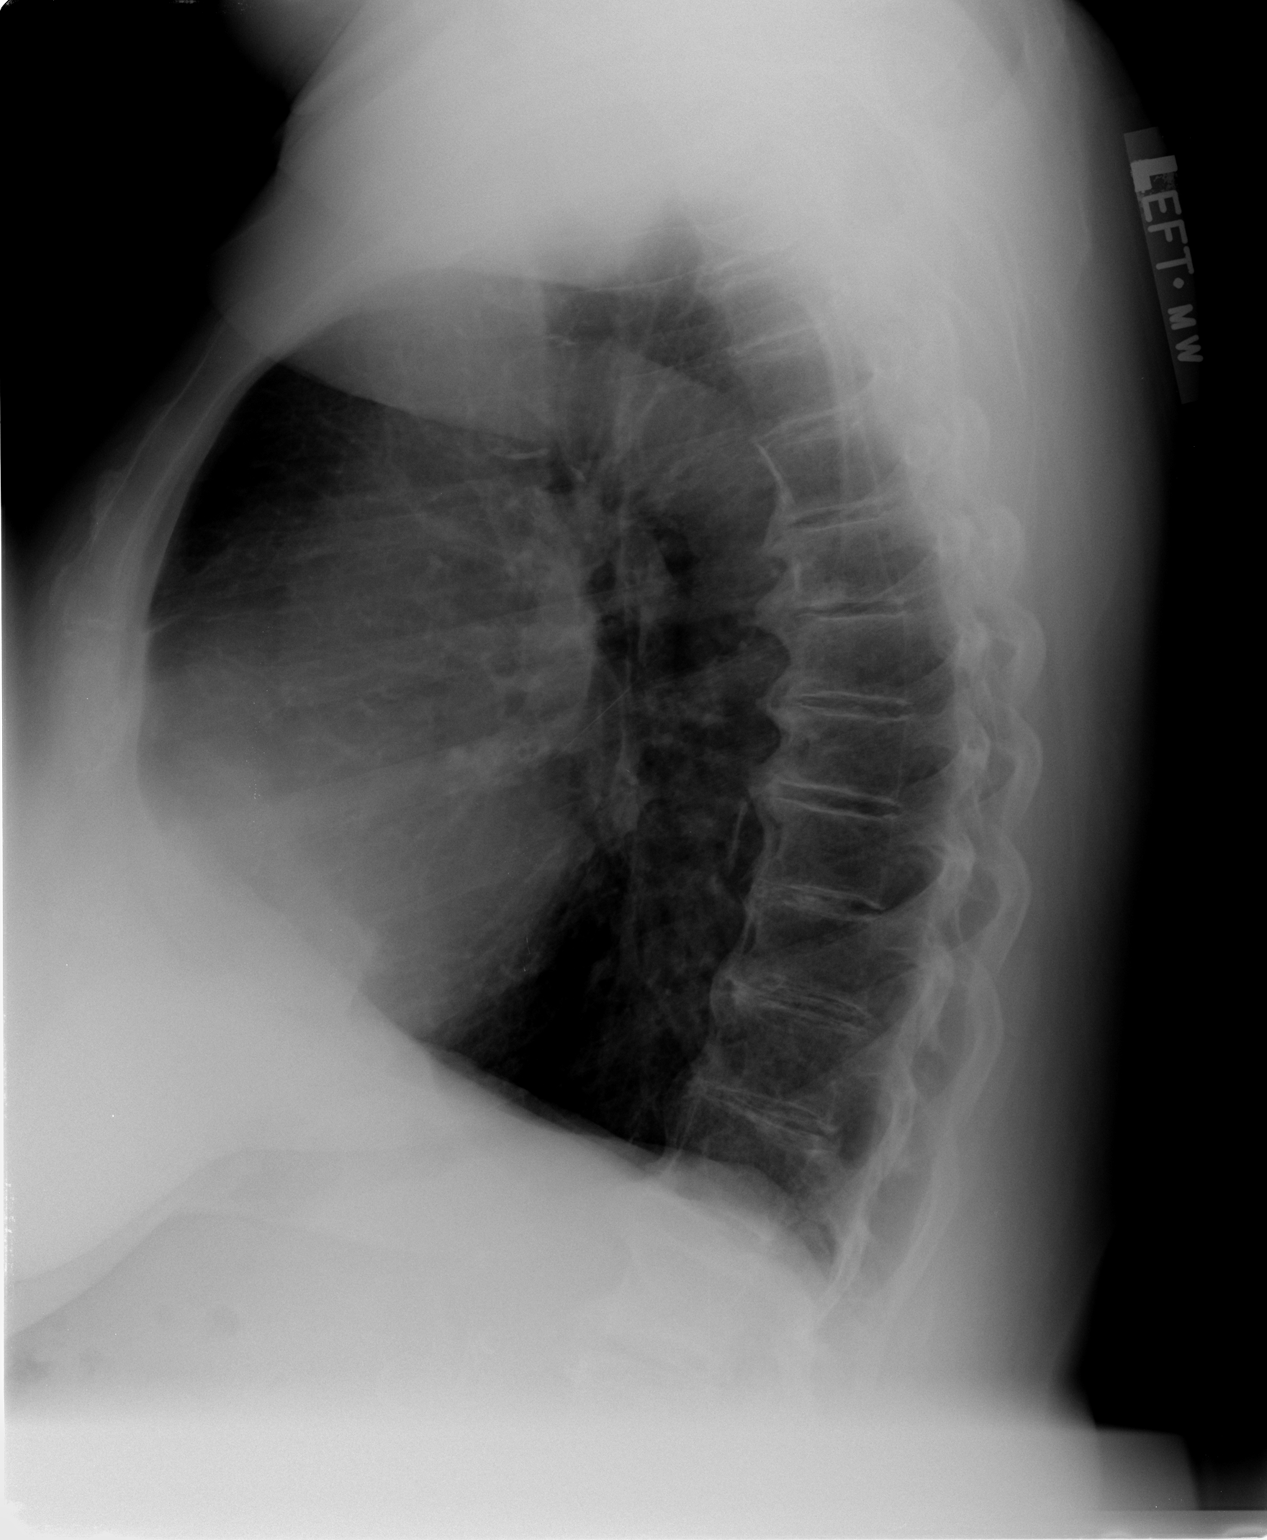

[2 of 2 positions shown; findings below may reference images not displayed]

FINDINGS: Hyperinflation is present.  Bosselation of both
hemidiaphragms.  Enlargement of the retrosternal clear space and
flattening of the hemidiaphragms.  Thoracic spine DISH.

The cardiopericardial silhouette is within normal limits for size.
The mediastinal contours are within normal limits.  Mild asymmetric
right pleural apical thickening.
IMPRESSION: Hyperinflation consistent with emphysema.  No acute cardiopulmonary
disease.

## 2012-12-24 ENCOUNTER — Encounter: Payer: Self-pay | Admitting: Cardiology

## 2013-01-02 ENCOUNTER — Ambulatory Visit (INDEPENDENT_AMBULATORY_CARE_PROVIDER_SITE_OTHER): Payer: Medicare Other | Admitting: Pharmacist

## 2013-01-02 ENCOUNTER — Encounter: Payer: Self-pay | Admitting: Cardiology

## 2013-01-02 ENCOUNTER — Ambulatory Visit (INDEPENDENT_AMBULATORY_CARE_PROVIDER_SITE_OTHER): Payer: Medicare Other | Admitting: Cardiology

## 2013-01-02 ENCOUNTER — Telehealth: Payer: Self-pay | Admitting: *Deleted

## 2013-01-02 ENCOUNTER — Encounter: Payer: Self-pay | Admitting: General Surgery

## 2013-01-02 VITALS — BP 134/76 | HR 84 | Ht 62.0 in | Wt 197.0 lb

## 2013-01-02 DIAGNOSIS — Z79899 Other long term (current) drug therapy: Secondary | ICD-10-CM

## 2013-01-02 DIAGNOSIS — I4891 Unspecified atrial fibrillation: Secondary | ICD-10-CM

## 2013-01-02 DIAGNOSIS — Z7901 Long term (current) use of anticoagulants: Secondary | ICD-10-CM

## 2013-01-02 DIAGNOSIS — I10 Essential (primary) hypertension: Secondary | ICD-10-CM | POA: Insufficient documentation

## 2013-01-02 LAB — POCT INR: INR: 4.3

## 2013-01-02 MED ORDER — APIXABAN 5 MG PO TABS: 5.0000 mg | ORAL_TABLET | Freq: Two times a day (BID) | ORAL | Status: DC

## 2013-01-02 NOTE — Progress Notes (Signed)
Pt will start Eliquis later this week once INR is < 2.0 for non-valvular Afib.   Reviewed patients medication list.  Pt is not currently on any combined P-gp and strong CYP3A4 inhibitors/inducers (ketoconazole, traconazole, ritonavir, carbamazepine, phenytoin, rifampin, St. John's wort).  Reviewed labs.  SCr 1.1 04/2012, Weight 89 kg, CrCl- 60 ml/min.  Dose appropriate based on CrCl.   Hgb and HCT Within Normal Limits.   A full discussion of the nature of anticoagulants has been carried out.  A benefit/risk analysis has been presented to the patient, so that they understand the justification for choosing anticoagulation with Eliquis at this time.  The need for compliance is stressed.  Pt is aware to take the medication twice daily.  Side effects of potential bleeding are discussed, including unusual colored urine or stools, coughing up blood or coffee ground emesis, nose bleeds or serious fall or head trauma.  Discussed signs and symptoms of stroke. The patient should avoid any OTC items containing aspirin or ibuprofen.  Avoid alcohol consumption.   Call if any signs of abnormal bleeding.  Discussed financial obligations and resolved any difficulty in obtaining medication.  Getting blood work today per Dr. Mayford Knife.

## 2013-01-02 NOTE — Telephone Encounter (Signed)
[employer] approval for Eliquis 5 mg tablets for 1 year, PA # [id number]

## 2013-01-02 NOTE — Patient Instructions (Addendum)
Your physician has recommended you make the following change in your medication: Start Eliquis 5 MG BID   Your physician has requested that you have an echocardiogram. Echocardiography is a painless test that uses sound waves to create images of your heart. It provides your doctor with information about the size and shape of your heart and how well your heart's chambers and valves are working. This procedure takes approximately one hour. There are no restrictions for this procedure.  Your physician recommends that you go to the lab today for BMET and CBC  Your physician recommends that you schedule a follow-up appointment in: 4 weeks with Dr Mayford Knife

## 2013-01-02 NOTE — Progress Notes (Signed)
[address] [place], [place]  [address] Phone: [phone] Fax:  [phone]  Date:  01/02/2013   ID:  Jonise, Weightman [birth year], MRN [id number]  PCP:  Hoyle Sauer, MD  Cardiologist:  Armanda Magic, MD     History of Present Illness: Mary Doyle is a 79 y.o. female with a history of HTN who presents for evaluation of new onset atrial fibrillation.  She went for a routine PE last week and was noted to be in afib.  She is asymptomatic.  She denies any chest pain, SOB, palpitations, LE edema, dizziness or syncope. She was also noted to have exertional desaturation down to 87% with ambulation and increased to 96% on 2L [place].  The patient refused O2.  She was started on Coumadin.  She is now referred for cardiac evaluation.  She does have some daytime sleepiness but does not know if she snores.   Wt Readings from Last 3 Encounters:  01/02/13 197 lb (89.359 kg)  04/12/12 207 lb 1.6 oz (93.94 kg)  09/17/11 214 lb 4.8 oz (97.206 kg)     Past Medical History  Diagnosis Date  . Diabetes mellitus   . Hyperparathyroidism   . Hyperlipidemia   . Glaucoma   . Lump in female breast   . Renal artery aneurysm   . Adrenal mass   . Renal artery stenosis   . Hypertension   . Breast cancer   . Atrial fibrillation, new onset     Current Outpatient Prescriptions  Medication Sig Dispense Refill  . alendronate (FOSAMAX) 70 MG tablet       . amLODipine (NORVASC) 5 MG tablet Take 5 mg by mouth.       Marland Kitchen anastrozole (ARIMIDEX) 1 MG tablet Take 1 mg by mouth daily.      . Chromium-Cinnamon (CINNAMON PLUS CHROMIUM PO) Take 1 tablet by mouth daily.      . fluorometholone (FML) 0.1 % ophthalmic suspension       . fluticasone (FLONASE) 50 MCG/ACT nasal spray Place 2 sprays into both nostrils daily.       . furosemide (LASIX) 20 MG tablet Take 20 mg by mouth daily.       Marland Kitchen GLIPIZIDE XL 5 MG 24 hr tablet Take 5 mg by mouth daily with breakfast.       . hydrochlorothiazide 25 MG  tablet Take 25 mg by mouth daily.       . hydrOXYzine (ATARAX) 25 MG tablet Take 25 mg by mouth every 4 (four) hours as needed.       Marland Kitchen ibuprofen (ADVIL,MOTRIN) 200 MG tablet Take 200 mg by mouth every 6 (six) hours as needed.      . latanoprost (XALATAN) 0.005 % ophthalmic solution Place 1 drop into both eyes at bedtime.       Marland Kitchen LORazepam (ATIVAN) 0.5 MG tablet Take 0.5 mg by mouth every 6 (six) hours as needed.       Marland Kitchen losartan (COZAAR) 50 MG tablet Take 50 mg by mouth 2 (two) times daily.       . metFORMIN (GLUCOPHAGE) 1000 MG tablet Take 1,000 mg by mouth 2 (two) times daily with a meal.       . metoprolol (TOPROL-XL) 200 MG 24 hr tablet Take 200 mg by mouth daily.       . Multiple Vitamins-Minerals (CENTRUM SILVER ADULT 50+ PO) Take 1 tablet by mouth.      . ONE TOUCH ULTRA TEST  test strip       . promethazine (PHENERGAN) 25 MG tablet       . ranitidine (ZANTAC) 150 MG tablet Take 150 mg by mouth 2 (two) times daily.       . simvastatin (ZOCOR) 80 MG tablet Take 80 mg by mouth daily at 6 PM.        No current facility-administered medications for this visit.    Allergies:   No Known Allergies  Social History:  The patient  reports that she has never smoked. She does not have any smokeless tobacco history on file. She reports that she does not drink alcohol or use illicit drugs.   Family History:  The patient's family history includes Diabetes in her mother; Lung cancer in her sister; Ovarian cancer in her sister.   ROS:  Please see the history of present illness.      All other systems reviewed and negative.   PHYSICAL EXAM: VS:  BP 134/76  Pulse 84  Ht 5\' 2"  (1.575 m)  Wt 197 lb (89.359 kg)  BMI 36.02 kg/m2 Well nourished, well developed, in no acute distress HEENT: normal Neck: no JVD Cardiac:  normal S1, S2; RRR; no murmur Lungs:  clear to auscultation bilaterally, no wheezing, rhonchi or rales Abd: soft, nontender, no hepatomegaly Ext: no edema Skin: warm and  dry Neuro:  CNs 2-12 intact, no focal abnormalities noted  EKG:  Atrial fibrillation with CVR     ASSESSMENT AND PLAN:  1. New onset atrial fibrillation rate controlled and asymptomatic.  - continue metoprolol  - I will get a 2D echo to assess LVF  - I recommended a sleep study but patient refused it   - we discussed possible DCCV in 4 weeks but she is adamant that she does not want a cardioversion and wants to stay in afib in anticoaguation.  She is well rate controlled. 2. Chronic systemic anticoagulation  - I will stop her Coumadin and start her on Eliquis 5mg  BID  - check NOAC labs today 3. HTN - well controlled  - continue amlodipine/HCTZ/losartan/metoprolol  Followup with me in 4 weeks   Signed, Armanda Magic, MD 01/02/2013 2:31 PM

## 2013-01-02 NOTE — Patient Instructions (Signed)
A full discussion of the nature of anticoagulants has been carried out.  A benefit/risk analysis has been presented to the patient, so that they understand the justification for choosing anticoagulation with Eliquis at this time.  The need for compliance is stressed.  Pt is aware to take the medication twice daily.  Side effects of potential bleeding are discussed, including unusual colored urine or stools, coughing up blood or coffee ground emesis, nose bleeds or serious fall or head trauma.  Discussed signs and symptoms of stroke. The patient should avoid any OTC items containing aspirin or ibuprofen.  Avoid alcohol consumption.   Call if any signs of abnormal bleeding.  Discussed financial obligations and resolved any difficulty in obtaining medication.  Getting blood work today per Dr. Mayford Knife.

## 2013-01-03 ENCOUNTER — Telehealth: Payer: Self-pay | Admitting: Cardiology

## 2013-01-03 DIAGNOSIS — I4891 Unspecified atrial fibrillation: Secondary | ICD-10-CM

## 2013-01-03 LAB — BASIC METABOLIC PANEL
Chloride: 94 mEq/L — ABNORMAL LOW (ref 96–112)
Creatinine, Ser: 0.9 mg/dL (ref 0.4–1.2)
GFR: 64.05 mL/min (ref >60.00)

## 2013-01-03 NOTE — Telephone Encounter (Signed)
Spoke with pt's daughter and reviewed instructions from Dr. Mayford Knife based on lab results. I had spoken with pt earlier and given her instructions and pt verbalized understanding.  Daughter confirms pt does not want to schedule echo at this time.

## 2013-01-03 NOTE — Telephone Encounter (Signed)
Called Mary Doyle to schedule Echo.  She stated she didn't want to do this test because she has had a lot of X-ray and other things done to her heart.    Has refused to schedule test at this time.

## 2013-01-03 NOTE — Telephone Encounter (Signed)
Follow up    Pt's daughter returning Rn call to set up an appointment?  Please call her cell.

## 2013-01-05 ENCOUNTER — Telehealth: Payer: Self-pay | Admitting: Pharmacist

## 2013-01-05 NOTE — Telephone Encounter (Signed)
Patient called to state she had PT/INR drawn by Dr. Felipa Eth today, and wanted a call back to let her know when to start Eliquis.

## 2013-01-05 NOTE — Telephone Encounter (Signed)
INR was 1.2 at Dr. Vicente Males office today.  It is okay to start Eliquis 5 mg bid today.  Patient notified and she shows verbal understanding of plan.  FYI to Dr. Mayford Knife.

## 2013-01-09 NOTE — Progress Notes (Signed)
This was never done.

## 2013-01-10 ENCOUNTER — Other Ambulatory Visit (INDEPENDENT_AMBULATORY_CARE_PROVIDER_SITE_OTHER): Payer: Medicare Other

## 2013-01-10 DIAGNOSIS — I4891 Unspecified atrial fibrillation: Secondary | ICD-10-CM

## 2013-01-10 LAB — BASIC METABOLIC PANEL
CO2: 31 mEq/L (ref 19–32)
Chloride: 93 mEq/L — ABNORMAL LOW (ref 96–112)
Potassium: 4.8 mEq/L (ref 3.5–5.1)
Sodium: 130 mEq/L — ABNORMAL LOW (ref 135–145)

## 2013-01-11 NOTE — Progress Notes (Signed)
Will add on to next BMET for pt.

## 2013-01-12 ENCOUNTER — Other Ambulatory Visit: Payer: Self-pay | Admitting: General Surgery

## 2013-01-12 DIAGNOSIS — Z79899 Other long term (current) drug therapy: Secondary | ICD-10-CM

## 2013-01-18 ENCOUNTER — Other Ambulatory Visit (INDEPENDENT_AMBULATORY_CARE_PROVIDER_SITE_OTHER): Payer: Medicare Other

## 2013-01-18 DIAGNOSIS — Z79899 Other long term (current) drug therapy: Secondary | ICD-10-CM

## 2013-01-18 LAB — BASIC METABOLIC PANEL
BUN: 15 mg/dL (ref 6–23)
CO2: 29 mEq/L (ref 19–32)
Calcium: 10.8 mg/dL — ABNORMAL HIGH (ref 8.4–10.5)
Chloride: 96 mEq/L (ref 96–112)
Glucose, Bld: 180 mg/dL — ABNORMAL HIGH (ref 70–99)
Potassium: 4.2 mEq/L (ref 3.5–5.1)
Sodium: 134 mEq/L — ABNORMAL LOW (ref 135–145)

## 2013-01-18 LAB — CBC
HCT: 39.5 % (ref 36.0–46.0)
Hemoglobin: 13.4 g/dL (ref 12.0–15.0)
MCHC: 33.9 g/dL (ref 30.0–36.0)
Platelets: 187 10*3/uL (ref 150.0–400.0)
RDW: 13.5 % (ref 11.5–14.6)

## 2013-01-19 ENCOUNTER — Other Ambulatory Visit: Payer: Medicare Other

## 2013-01-27 ENCOUNTER — Telehealth: Payer: Self-pay | Admitting: Cardiology

## 2013-01-27 NOTE — Telephone Encounter (Signed)
New Problem:  Pt is c/o of her legs and feet being swollen. Pt states she was on a fluid pill until a month or so ago. Pt states she needs something again to help her get the fluid off. Pt would like a call back.

## 2013-01-27 NOTE — Telephone Encounter (Signed)
To Dr Mayford Knife to advise since she is in the hospital today.

## 2013-01-31 NOTE — Telephone Encounter (Signed)
Please have her come in to see our NP in the office or her PCP

## 2013-02-01 NOTE — Telephone Encounter (Signed)
Pt daughter stated that she went to PCP. She had SOB and swelling really bad on her feet and legs. Monday they put  Her back on the HCTZ. She has a follow up with her PCP next week.

## 2013-02-03 ENCOUNTER — Telehealth: Payer: Self-pay | Admitting: Oncology

## 2013-02-03 NOTE — Telephone Encounter (Signed)
, °

## 2013-02-23 ENCOUNTER — Telehealth: Payer: Self-pay | Admitting: Cardiology

## 2013-02-23 DIAGNOSIS — I4891 Unspecified atrial fibrillation: Secondary | ICD-10-CM

## 2013-02-23 NOTE — Telephone Encounter (Signed)
Pt needs after 11:00 Am Echo appointment. To schedule appt call the pts daughter Opal Sidles her number is [phone]

## 2013-02-23 NOTE — Telephone Encounter (Signed)
New Problem:  Pt's daughter, Opal Sidles, is calling to set up an Echo appt. However, the pt does not have any orders or a recall for an echo. Could you clarify if the pt needs an Echo appt. The daughter is requesting a call back from the nurse.

## 2013-03-20 ENCOUNTER — Other Ambulatory Visit (HOSPITAL_COMMUNITY): Payer: Medicare Other

## 2013-04-13 ENCOUNTER — Other Ambulatory Visit: Payer: Medicare Other

## 2013-04-13 ENCOUNTER — Ambulatory Visit: Payer: Medicare Other | Admitting: Oncology

## 2013-04-28 ENCOUNTER — Other Ambulatory Visit: Payer: Self-pay | Admitting: *Deleted

## 2013-04-28 DIAGNOSIS — C50919 Malignant neoplasm of unspecified site of unspecified female breast: Secondary | ICD-10-CM

## 2013-05-01 ENCOUNTER — Ambulatory Visit (HOSPITAL_BASED_OUTPATIENT_CLINIC_OR_DEPARTMENT_OTHER): Payer: Medicare Other | Admitting: Oncology

## 2013-05-01 ENCOUNTER — Other Ambulatory Visit (HOSPITAL_BASED_OUTPATIENT_CLINIC_OR_DEPARTMENT_OTHER): Payer: Medicare Other

## 2013-05-01 ENCOUNTER — Encounter: Payer: Self-pay | Admitting: Oncology

## 2013-05-01 VITALS — BP 133/80 | HR 78 | Temp 98.3°F | Resp 18 | Ht 62.0 in | Wt 208.2 lb

## 2013-05-01 DIAGNOSIS — C50919 Malignant neoplasm of unspecified site of unspecified female breast: Secondary | ICD-10-CM

## 2013-05-01 LAB — CBC WITH DIFFERENTIAL/PLATELET
BASO%: 0.6 % (ref 0.0–2.0)
Basophils Absolute: 0 10*3/uL (ref 0.0–0.1)
EOS%: 6.2 % (ref 0.0–7.0)
Eosinophils Absolute: 0.5 10*3/uL (ref 0.0–0.5)
HEMATOCRIT: 41.7 % (ref 34.8–46.6)
HGB: 13.8 g/dL (ref 11.6–15.9)
LYMPH#: 1.9 10*3/uL (ref 0.9–3.3)
LYMPH%: 23.9 % (ref 14.0–49.7)
MCH: 30.3 pg (ref 25.1–34.0)
MCHC: 33.2 g/dL (ref 31.5–36.0)
MCV: 91.4 fL (ref 79.5–101.0)
MONO#: 0.6 10*3/uL (ref 0.1–0.9)
MONO%: 7.8 % (ref 0.0–14.0)
NEUT#: 5 10*3/uL (ref 1.5–6.5)
NEUT%: 61.5 % (ref 38.4–76.8)
Platelets: 174 10*3/uL (ref 145–400)
RBC: 4.56 10*6/uL (ref 3.70–5.45)
RDW: 13 % (ref 11.2–14.5)
WBC: 8.2 10*3/uL (ref 3.9–10.3)

## 2013-05-01 LAB — COMPREHENSIVE METABOLIC PANEL (CC13)
ALT: 9 U/L (ref 0–55)
AST: 14 U/L (ref 5–34)
Albumin: 3.9 g/dL (ref 3.5–5.0)
Alkaline Phosphatase: 61 U/L (ref 40–150)
Anion Gap: 9 mEq/L (ref 3–11)
BILIRUBIN TOTAL: 0.67 mg/dL (ref 0.20–1.20)
BUN: 16.6 mg/dL (ref 7.0–26.0)
CALCIUM: 10.6 mg/dL — AB (ref 8.4–10.4)
CHLORIDE: 93 meq/L — AB (ref 98–109)
CO2: 32 mEq/L — ABNORMAL HIGH (ref 22–29)
Creatinine: 1 mg/dL (ref 0.6–1.1)
Glucose: 199 mg/dl — ABNORMAL HIGH (ref 70–140)
Potassium: 4.5 mEq/L (ref 3.5–5.1)
Sodium: 134 mEq/L — ABNORMAL LOW (ref 136–145)
Total Protein: 6.9 g/dL (ref 6.4–8.3)

## 2013-05-01 MED ORDER — ANASTROZOLE 1 MG PO TABS: 1.0000 mg | ORAL_TABLET | Freq: Every day | ORAL | Status: DC

## 2013-05-01 NOTE — Patient Instructions (Signed)
Doing well, we will continue arimidex 1 mg daily  Bone density scan need to have done every 2 years, you can have them done at your PCP with a copy to me  Breast Cancer Survivor Follow-Up Breast cancer begins when cells in the breast divide too rapidly. The extra cells form a lump (tumor). When the cancer is treated, the goal is to get rid of all cancer cells. However, sometimes a few cells survive. These cancer cells can then grow. They become recurrent cancer. This means the cancer comes back after treatment.  Most cases of recurrent breast cancer develop 3 to 5 years after treatment. However, sometimes it comes back just a few months after treatment. Other times, it does not come back until years later. If the cancer comes back in the same area as the first breast cancer, it is called a local recurrence. If the cancer comes back somewhere else in the body, it is called regional recurrence if the site is fairly near the breast or distant recurrence if it is far from the breast. Your caregiver may also use the term metastasize to indicate a cancer that has gone to another part of your body. Treatment is still possible after either kind of recurrence. The cancer can still be controlled.  CAUSES OF RECURRENT CANCER No one knows exactly why breast cancer starts in the first place. Why the cancer comes back after treatment is also not clear. It is known that certain conditions, called risk factors, can make this more likely. They include:  Developing breast cancer for the first time before age 57.  Having breast cancer that involves the lymph nodes. These are small, round pieces of tissue found all over the body. Their job is to help fight infections.  Having a large tumor. Cancer is more apt to come back if the first tumor was bigger than 2 inches (5 cm).  Having certain types of breast cancer, such as:  Inflammatory breast cancer. This rare type grows rapidly and causes the breast to become red and  swollen.  A high-grade tumor. The grade of a tumor indicates how fast it will grow and spread. High-grade tumors grow more quickly than other types.  HER2 cancer. This refers to the tumor's genetic makeup. Tumors that have this type of gene are more likely to come back after treatment.  Having close tumor margins. This refers to the space between the tumor and normal, noncancerous cells. If the space is small, the tumor has a greater chance of coming back.  Having treatment involving a surgery to remove the tumor but not the entire breast (lumpectomy) and no radiation therapy. CARE AFTER BREAST CANCER Home Monitoring Women who have had breast cancer should continue to examine their breasts every month. The goal is to catch the cancer quickly if it comes back. Many women find it helpful to do so on the same day each month and to mark the calendar as a reminder. Let your caregiver know immediately if you have any signs of recurrent breast cancer. Symptoms will vary, depending on where the cancer recurs. The original type of treatment can also make a difference. Symptoms of local recurrence after a lumpectomy or a recurrence in the opposite breast may include:  A new lump or thickening in the breast.  A change in the way the skin looks on the breast (such as a rash, dimpling, or wrinkling).  Redness or swelling of the breast.  Changes in the nipple (such as being  red, puckered, swollen, or leaking fluid). Symptoms of a recurrence after a breast removal surgery (mastectomy) may include:  A lump or thickening under the skin.  A thickening around the mastectomy scar. Symptoms of regional recurrence in the lymph nodes near the breast may include:  A lump under the arm or above the collarbone.  Swelling of the arm.  Pain in the arm, shoulder, or chest.  Numbness in the hand or arm. Symptoms of distant recurrence may include:  A cough that does not go away.  Trouble breathing or  shortness of breath.  Pain in the bones or the chest. This is pain that lasts or does not respond to rest and medicine.  Headaches.  Sudden vision problems.  Dizziness.  Nausea or vomiting.  Losing weight without trying to.  Persistent abdominal pain.  Changes in bowel movements or blood in the stool.  Yellowing of the skin or eyes (jaundice).  Blood in the urine or bloody vaginal discharge. Clinical Monitoring  It is helpful to keep a schedule of appointments for needed tests and exams. This includes physical exams, breast exams, exams of the lymph nodes, and general exams.  For the first 3 years after being treated for breast cancer, see your caregiver every 3 to 6 months.  For years 4 and 5 after breast cancer, see your caregiver every 6 to 12 months.  After 5 years, see your caregiver at least once a year.  Regular breast X-rays (mammograms) should continue even if you had a mastectomy.  A mammogram should be done 1 year after the mammogram that first detected breast cancer.  A mammogram should be done every 6 to 12 months after that. Follow your caregiver's advice.  A pelvic exam done by your caregiver checks whether female organs are the normal size and shape. The exam is usually done every year. Ask your caregiver if that schedule is right for you.  Women taking tamoxifen should report any vaginal bleeding immediately to their caregiver. Tamoxifen is often given to women with a certain type of breast cancer. It has been shown to help prevent recurrence.  You will need to decide who your primary caregiver will be.  Most people continue to see their cancer specialist (oncologist) every 3 to 6 months for the first year after cancer treatment.  At some point, you may want to go back to seeing your family caregiver. You would no longer see your oncologist for regular checkups. Many women do this about 1 year after their first diagnosis of breast cancer.  You will  still need to be seen every so often by your oncologist. Ask how often that should be. Coordinate this with your family or primary caregiver.  Think about having genetic counseling. This would provide information on traits that can be passed or inherited from one generation to the next. In some cases, breast cancer runs in families. Tell your caregiver if you:  Are of Ashkenazi Jewish heritage.  Have any family member who has had ovarian cancer.  Have a mother, sister, or daughter who had breast cancer before age 68.  Have 2 or more close relatives who have had breast cancer. This means a mother, sister, daughter, aunt, or grandmother.  Had breast cancer in both breasts.  Have a female relative who has had breast cancer.  Some tests are not recommended for routine screening. Someone recovering from breast cancer does not need to have these tests if there are no problems. The tests have risks,  such as radiation exposure, and can be costly. The risks of these tests are thought to be greater than the benefits:  Blood tests.  Chest X-rays.  Bone scans.  Liver ultrasound.  Computed tomography (CT scan).  Positron emission tomography (PET scan).  Magnetic resonance imaging (MRI scan). DIAGNOSIS OF RECURRENT CANCER Recurrent breast cancer may be suspected for various reasons. A mammogram may not look normal. You might feel a lump or have other symptoms. Your caregiver may find something unusual during an exam. To be sure, your caregiver will probably order some tests. The tests are needed because there are symptoms or hints of a problem. They could include:  Blood tests, including a test to check how well the liver is working. The liver is a common site for a distant cancer recurrence.  Imaging tests that create pictures of the inside of the body. These tests include:  Chest X-rays to show if the cancer has come back in the lungs.  CT scans to create detailed pictures of various areas  of the body and help find a distant recurrence.  MRI scans to find anything unusual in the breast, chest, or lymph nodes.  Breast ultrasound tests to examine the breasts.  Bone scans to create a picture of your whole skeleton and find cancer in bony areas.  PET scans to create an image of the whole body. PET scans can be used together with CT scans to show more detail.  Biopsy. A small sample of tissue is taken and checked under a microscope. If cancer cells are found, they may be tested to see if they contain the HER2 gene or the hormones estrogen and progesterone. This will help your caregiver decide how to treat the recurrent cancer. TREATMENT  How recurrent breast cancer is treated depends on where the new cancer is found. The type of treatment that was used for the first breast cancer makes a difference, too. A combination of treatments may be used. Options include:  Surgery.  If the cancer comes back in the breast that was not treated before, you may need a lumpectomy or mastectomy.  If the cancer comes back in the breast that was treated before, you may need a mastectomy.  The lymph nodes under the arm may need to be removed.  Radiation therapy.  For a local recurrence, radiation may be used if it was not used during the first treatment.  For a distance recurrence, radiation is sometimes used.  Chemotherapy.  This may be used before surgery to treat recurrent breast cancer.  This may be used to treat recurrent cancer that cannot be treated with surgery.  This may be used to treat a distant recurrence.  Hormone therapy.  Women with the HER2 gene may be given hormone therapy to attack this gene. Document Released: 09/17/2010 Document Revised: 04/13/2011 Document Reviewed: 09/17/2010 [facility] Patient Information 2014 [place], [place].

## 2013-05-02 ENCOUNTER — Telehealth: Payer: Self-pay | Admitting: Oncology

## 2013-05-02 NOTE — Progress Notes (Signed)
[place] OFFICE PROGRESS NOTE  Patient Care Team: Tivis Ringer, MD as PCP - General (Internal Medicine) Dr. Erroll Luna  DIAGNOSIS: 80 year old with 4.3 cm left breast cancer, ER+ s/p left breast mastectomy   SUMMARY OF ONCOLOGIC HISTORY: 1.S/p left breast mastectomy with SNL , final path showed a well-differentiated IDC measuring 4.3 cm  2. Now on arimidex 1 mg po daily   CURRENT THERAPY:arimidex 1 mg daily since December 2012   INTERVAL HISTORY: Mary Doyle 80 y.o. female returns for followup visit today. She has been seen on an annual basis per her request. Overall she seems to be doing well. She is tolerating Arimidex very nicely. She's been on this since December 2012. She is denying any fevers chills night sweats headaches shortness of breath chest pains palpitations no myalgias and arthralgias. Remainder of the 10 point review of systems is unremarkable and as below  I have reviewed the past medical history, past surgical history, social history and family history with the patient and they are unchanged  ALLERGIES:  has No Known Allergies.  MEDICATIONS:  Current Outpatient Prescriptions  Medication Sig Dispense Refill  . alendronate (FOSAMAX) 70 MG tablet       . amLODipine (NORVASC) 5 MG tablet Take 5 mg by mouth.       Marland Kitchen anastrozole (ARIMIDEX) 1 MG tablet Take 1 tablet (1 mg total) by mouth daily.  30 tablet  12  . apixaban (ELIQUIS) 5 MG TABS tablet Take 1 tablet (5 mg total) by mouth 2 (two) times daily.  60 tablet  11  . Chromium-Cinnamon (CINNAMON PLUS CHROMIUM PO) Take 1 tablet by mouth daily.      . fluorometholone (FML) 0.1 % ophthalmic suspension       . fluticasone (FLONASE) 50 MCG/ACT nasal spray Place 2 sprays into both nostrils daily.       Marland Kitchen GLIPIZIDE XL 5 MG 24 hr tablet Take 5 mg by mouth daily with breakfast.       . hydrOXYzine (ATARAX) 25 MG tablet Take 25 mg by mouth every 4 (four) hours as needed.       Marland Kitchen ibuprofen  (ADVIL,MOTRIN) 200 MG tablet Take 200 mg by mouth every 6 (six) hours as needed.      . latanoprost (XALATAN) 0.005 % ophthalmic solution Place 1 drop into both eyes at bedtime.       Marland Kitchen LORazepam (ATIVAN) 0.5 MG tablet Take 0.5 mg by mouth every 6 (six) hours as needed.       Marland Kitchen losartan (COZAAR) 50 MG tablet Take 50 mg by mouth 2 (two) times daily.       . metFORMIN (GLUCOPHAGE) 1000 MG tablet Take 1,000 mg by mouth 2 (two) times daily with a meal.       . metoprolol (TOPROL-XL) 200 MG 24 hr tablet Take 200 mg by mouth daily.       . Multiple Vitamins-Minerals (CENTRUM SILVER ADULT 50+ PO) Take 1 tablet by mouth.      . ONE TOUCH ULTRA TEST test strip       . promethazine (PHENERGAN) 25 MG tablet       . ranitidine (ZANTAC) 150 MG tablet Take 150 mg by mouth 2 (two) times daily.       . simvastatin (ZOCOR) 80 MG tablet Take 80 mg by mouth daily at 6 PM.        No current facility-administered medications for this visit.    REVIEW  OF SYSTEMS:   Constitutional: Denies fevers, chills or abnormal weight loss Eyes: Denies blurriness of vision Ears, nose, mouth, throat, and face: Denies mucositis or sore throat Respiratory: Denies cough, dyspnea or wheezes Cardiovascular: Denies palpitation, chest discomfort or lower extremity swelling Gastrointestinal:  Denies nausea, heartburn or change in bowel habits Skin: Denies abnormal skin rashes Lymphatics: Denies new lymphadenopathy or easy bruising Neurological:Denies numbness, tingling or new weaknesses Behavioral/Psych: Mood is stable, no new changes  All other systems were reviewed with the patient and are negative.  PHYSICAL EXAMINATION: ECOG PERFORMANCE STATUS: 1 - Symptomatic but completely ambulatory  Filed Vitals:   05/01/13 1114  BP: 133/80  Pulse: 78  Temp: 98.3 F (36.8 C)  Resp: 18   Filed Weights   05/01/13 1114  Weight: 208 lb 3.2 oz (94.439 kg)    GENERAL:alert, no distress and comfortable SKIN: skin color, texture,  turgor are normal, no rashes or significant lesions EYES: normal, Conjunctiva are pink and non-injected, sclera clear OROPHARYNX:no exudate, no erythema and lips, buccal mucosa, and tongue normal  NECK: supple, thyroid normal size, non-tender, without nodularity LYMPH:  no palpable lymphadenopathy in the cervical, axillary or inguinal LUNGS: clear to auscultation and percussion with normal breathing effort HEART: regular rate & rhythm and no murmurs and no lower extremity edema ABDOMEN:abdomen soft, non-tender and normal bowel sounds Musculoskeletal:no cyanosis of digits and no clubbing  NEURO: alert & oriented x 3 with fluent speech, no focal motor/sensory deficits Left mastectomy scar is well healed no evidence of local recurrence. He can barely see the scar. Right breast no masses nipple discharge skin changes.  LABORATORY DATA:  I have reviewed the data as listed    Component Value Date/Time   NA 134* 05/01/2013 1102   NA 134* 1Jul 30, 202014 1202   K 4.5 05/01/2013 1102   K 4.2 1Jul 30, 202014 1202   CL 96 1Jul 30, 202014 1202   CL 94* 04/12/2012 1417   CO2 32* 05/01/2013 1102   CO2 29 1Jul 30, 202014 1202   GLUCOSE 199* 05/01/2013 1102   GLUCOSE 180* 1Jul 30, 202014 1202   GLUCOSE 134* 04/12/2012 1417   BUN 16.6 05/01/2013 1102   BUN 15 1Jul 30, 202014 1202   CREATININE 1.0 05/01/2013 1102   CREATININE 0.8 1Jul 30, 202014 1202   CALCIUM 10.6* 05/01/2013 1102   CALCIUM 10.8* 1Jul 30, 202014 1202   CALCIUM 12.0* 09/25/2010 0000   PROT 6.9 05/01/2013 1102   PROT 6.3 09/17/2011 1145   ALBUMIN 3.9 05/01/2013 1102   ALBUMIN 4.1 09/17/2011 1145   AST 14 05/01/2013 1102   AST 17 09/17/2011 1145   ALT 9 05/01/2013 1102   ALT 14 09/17/2011 1145   ALKPHOS 61 05/01/2013 1102   ALKPHOS 38* 09/17/2011 1145   BILITOT 0.67 05/01/2013 1102   BILITOT 0.8 09/17/2011 1145   GFRNONAA >60 09/04/2010 1326   GFRAA >60 09/04/2010 1326    No results found for this basename: SPEP, UPEP,  kappa and lambda light chains    Lab Results  Component  Value Date   WBC 8.2 05/01/2013   NEUTROABS 5.0 05/01/2013   HGB 13.8 05/01/2013   HCT 41.7 05/01/2013   MCV 91.4 05/01/2013   PLT 174 05/01/2013      Chemistry      Component Value Date/Time   NA 134* 05/01/2013 1102   NA 134* 1Jul 30, 202014 1202   K 4.5 05/01/2013 1102   K 4.2 1Jul 30, 202014 1202   CL 96 1Jul 30, 202014 1202   CL 94* 04/12/2012 1417   CO2 32* 05/01/2013 1102  CO2 29 1Jun 02, 202014 1202   BUN 16.6 05/01/2013 1102   BUN 15 1Jun 02, 202014 1202   CREATININE 1.0 05/01/2013 1102   CREATININE 0.8 1Jun 02, 202014 1202      Component Value Date/Time   CALCIUM 10.6* 05/01/2013 1102   CALCIUM 10.8* 1Jun 02, 202014 1202   CALCIUM 12.0* 09/25/2010 0000   ALKPHOS 61 05/01/2013 1102   ALKPHOS 38* 09/17/2011 1145   AST 14 05/01/2013 1102   AST 17 09/17/2011 1145   ALT 9 05/01/2013 1102   ALT 14 09/17/2011 1145   BILITOT 0.67 05/01/2013 1102   BILITOT 0.8 09/17/2011 1145       RADIOGRAPHIC STUDIES: I have personally reviewed the radiological images as listed and agreed with the findings in the report. No results found.    ASSESSMENT & PLAN:  80 year old female with  #1 history of left breast cancer measuring 4.3 cm (stage II) tumor was ER positive. She underwent a mastectomy with sentinel lymph node biopsy. Final pathology showed a well-differentiated invasive ductal carcinoma. Nodes were negative. She has no evidence of local recurrence. She has been on Arimidex 1 mg daily tolerating it well without any significant problems. Total of 5 years of therapy is planned.  #2 diabetes: Patient's blood sugars to run a little bit high. We discussed this. She is to try to control the better.  #3 hypercalcemia it is slowly improving. She is followed by her regular doctor for this.  #4 followup: Patient will be seen back in one year. Prescriptions for Arimidex were sent to her pharmacy. Patient does need a mammogram her last mammogram that we see in [facility] system is from August 2013. She will set up appointments in the next  few months. We also discussed bone density. Patient understands with Arimidex there is risk for bone loss. She will speak to her primary care doctor regarding getting bone density scans.  Orders Placed This Encounter  Procedures  . CBC with Differential    Standing Status: Future     Number of Occurrences:      Standing Expiration Date: 05/01/2014  . Comprehensive metabolic panel (Cmet) - [facility]    Standing Status: Future     Number of Occurrences:      Standing Expiration Date: 05/01/2014   All questions were answered. The patient knows to call the clinic with any problems, questions or concerns. No barriers to learning was detected. I spent 15 minutes counseling the patient face to face. The total time spent in the appointment was 25 minutes and more than 50% was on counseling and review of test results and coordination of care     Marcy Panning, MD 05/02/2013 3:01 PM

## 2013-05-02 NOTE — Telephone Encounter (Signed)
, °

## 2013-06-06 ENCOUNTER — Ambulatory Visit (HOSPITAL_COMMUNITY): Payer: Medicare Other | Attending: Cardiology | Admitting: Radiology

## 2013-06-06 DIAGNOSIS — I059 Rheumatic mitral valve disease, unspecified: Secondary | ICD-10-CM | POA: Insufficient documentation

## 2013-06-06 DIAGNOSIS — E785 Hyperlipidemia, unspecified: Secondary | ICD-10-CM | POA: Insufficient documentation

## 2013-06-06 DIAGNOSIS — I4891 Unspecified atrial fibrillation: Secondary | ICD-10-CM

## 2013-06-06 DIAGNOSIS — I10 Essential (primary) hypertension: Secondary | ICD-10-CM | POA: Insufficient documentation

## 2013-06-06 DIAGNOSIS — E119 Type 2 diabetes mellitus without complications: Secondary | ICD-10-CM | POA: Insufficient documentation

## 2013-06-06 DIAGNOSIS — C50919 Malignant neoplasm of unspecified site of unspecified female breast: Secondary | ICD-10-CM | POA: Insufficient documentation

## 2013-06-06 DIAGNOSIS — I079 Rheumatic tricuspid valve disease, unspecified: Secondary | ICD-10-CM | POA: Insufficient documentation

## 2013-06-06 NOTE — Progress Notes (Signed)
Echocardiogram performed.  

## 2013-06-27 ENCOUNTER — Telehealth: Payer: Self-pay | Admitting: Cardiology

## 2013-06-27 ENCOUNTER — Encounter: Payer: Self-pay | Admitting: *Deleted

## 2013-06-27 ENCOUNTER — Other Ambulatory Visit: Payer: Self-pay | Admitting: Cardiology

## 2013-06-27 DIAGNOSIS — I4891 Unspecified atrial fibrillation: Secondary | ICD-10-CM

## 2013-06-27 NOTE — Telephone Encounter (Signed)
She will need an updated H&P that is within 30 days of last OV at time of procedure

## 2013-06-27 NOTE — Telephone Encounter (Signed)
Spoke with patient's daughter, Opal Sidles.

## 2013-06-27 NOTE — Telephone Encounter (Signed)
Pt's daughter, Opal Sidles requesting to schedule DCCV for 07/12/13.

## 2013-06-27 NOTE — Telephone Encounter (Signed)
DCCV scheduled for July 12, 2013 at 12:30PM with Dr Radford Pax. I will mail a copy of instructions to patient. Opal Sidles is aware of this. I did confirm that pt is taking Eliquis and has not missed any doses of this.

## 2013-06-27 NOTE — Telephone Encounter (Signed)
To Dr Turner---Do you want her to come in for an office visit with you before the cardioversion?

## 2013-06-27 NOTE — Telephone Encounter (Signed)
Opal Sidles said pt did not want to schedule appt with Dr Radford Pax but just wanted to schedule cardioversion without seeing Dr Radford Pax in the office first. Opal Sidles did ask it pt needed lab prior to cardioversion. She is aware that I am forwarding this to Dr Radford Pax to make her aware that cardioversion has been scheduled and to see if Dr Radford Pax recommends any lab prior to cardioversion June 10.

## 2013-06-27 NOTE — Telephone Encounter (Signed)
She can be set up with Nicki Reaper or Cecille Rubin for that

## 2013-06-27 NOTE — Telephone Encounter (Signed)
LMTCB

## 2013-06-27 NOTE — Telephone Encounter (Signed)
Pt's daughter has question about test that is being done on Friday. Please call and advise.

## 2013-06-27 NOTE — Telephone Encounter (Signed)
Appt scheduled with Richardson Dopp, PA,c 07/11/13 prior to cardioversion 07/12/13. Pt's daughter, Opal Sidles advised.

## 2013-06-29 ENCOUNTER — Encounter (HOSPITAL_COMMUNITY): Payer: Self-pay

## 2013-07-11 ENCOUNTER — Ambulatory Visit (INDEPENDENT_AMBULATORY_CARE_PROVIDER_SITE_OTHER): Payer: Medicare Other | Admitting: Physician Assistant

## 2013-07-11 ENCOUNTER — Encounter: Payer: Self-pay | Admitting: Physician Assistant

## 2013-07-11 VITALS — BP 140/80 | HR 82 | Ht 62.0 in | Wt 206.0 lb

## 2013-07-11 DIAGNOSIS — E119 Type 2 diabetes mellitus without complications: Secondary | ICD-10-CM

## 2013-07-11 DIAGNOSIS — E785 Hyperlipidemia, unspecified: Secondary | ICD-10-CM

## 2013-07-11 DIAGNOSIS — I4891 Unspecified atrial fibrillation: Secondary | ICD-10-CM

## 2013-07-11 DIAGNOSIS — I10 Essential (primary) hypertension: Secondary | ICD-10-CM

## 2013-07-11 NOTE — H&P (Signed)
Agree with above note as outlined by Richardson Dopp, PA-C

## 2013-07-11 NOTE — Progress Notes (Signed)
Cardiology Office Note   Date:  07/11/2013   ID:  DESHUNDA THACKSTON, DOB January 12, 1932, MRN [id number]  PCP:  Tivis Ringer, MD  Cardiologist:  Dr. Fransico Him      History of Present Illness: Mary Doyle is a 80 y.o. female with a hx of DM, HL, HTN, AFib, breast CA s/p L mastectomy.  Initially seen by Dr. Fransico Him for AFib in 01/2013.  She had been placed on Coumadin and this was changed to Eliquis.  Echo was obtained and this demonstrated normal LVF, mild to mod MR, mod TR and BAE.  DCCV after 4 weeks of uninterrupted anticoagulation had been discussed with the patient but she refused.  She recently contacted the office expressing interest in having the DCCV.  This is scheduled for tomorrow.  She returns for pre-procedure visit.  She is overall doing well.  She is limited by her knee DJD.  She notes NYHA 2-2b dyspnea.  Denies chest pain, syncope, orthopnea, PND, edema.     Studies:  - Echo (06/06/13):  Mild focal basal hypertrophy of the septum. EF 60% to 65%. Wall motion was normal.  Aortic sclerosis without stenosis.  Mild to moderate MR.  Mod LAE.  Mild RAE.  Mod TR.     Recent Labs: 05/01/2013: ALT 9; Creatinine 1.0; Hemoglobin 13.8; Potassium 4.5   Wt Readings from Last 3 Encounters:  07/11/13 206 lb (93.441 kg)  05/01/13 208 lb 3.2 oz (94.439 kg)  01/02/13 197 lb (89.359 kg)     Past Medical History  Diagnosis Date  . Diabetes mellitus   . Hyperparathyroidism   . Hyperlipidemia   . Glaucoma   . Lump in female breast   . Renal artery aneurysm   . Adrenal mass   . Renal artery stenosis   . Hypertension   . Breast cancer   . Atrial fibrillation, new onset     Current Outpatient Prescriptions  Medication Sig Dispense Refill  . alendronate (FOSAMAX) 70 MG tablet Take 70 mg by mouth once a week. Takes on Sundays      . amLODipine (NORVASC) 5 MG tablet Take 5 mg by mouth daily.       Marland Kitchen anastrozole (ARIMIDEX) 1 MG tablet Take 1 tablet (1 mg total) by mouth daily.   30 tablet  12  . apixaban (ELIQUIS) 5 MG TABS tablet Take 1 tablet (5 mg total) by mouth 2 (two) times daily.  60 tablet  11  . CINNAMON PO Take 1 capsule by mouth 2 (two) times daily.      . ergocalciferol (VITAMIN D2) 50000 UNITS capsule Take 50,000 Units by mouth once a week. Takes on Sundays      . fluorometholone (FML) 0.1 % ophthalmic suspension Place 1 drop into both eyes 2 (two) times daily as needed (irritation).       . fluticasone (FLONASE) 50 MCG/ACT nasal spray Place 1-2 sprays into both nostrils daily.       . furosemide (LASIX) 20 MG tablet Take 20 mg by mouth daily.      Marland Kitchen glipiZIDE (GLUCOTROL XL) 5 MG 24 hr tablet Take 5 mg by mouth 2 (two) times daily with a meal.      . hydrochlorothiazide (HYDRODIURIL) 25 MG tablet Take 25 mg by mouth daily.      Marland Kitchen latanoprost (XALATAN) 0.005 % ophthalmic solution Place 1 drop into both eyes at bedtime.       Marland Kitchen loratadine (CLARITIN) 10 MG tablet Take 10 mg  by mouth daily.      Marland Kitchen LORazepam (ATIVAN) 0.5 MG tablet Take 0.5 mg by mouth daily as needed for anxiety.       Marland Kitchen losartan (COZAAR) 100 MG tablet Take 50 mg by mouth 2 (two) times daily.      . metFORMIN (GLUCOPHAGE) 1000 MG tablet Take 1,000 mg by mouth 2 (two) times daily with a meal.       . metoprolol (TOPROL-XL) 200 MG 24 hr tablet Take 200 mg by mouth daily.       . Multiple Vitamins-Minerals (CENTRUM SILVER ADULT 50+ PO) Take 1 tablet by mouth daily.       . Omega-3 Fatty Acids (FISH OIL) 1000 MG CAPS Take 1,000 mg by mouth 2 (two) times daily.      . ONE TOUCH ULTRA TEST test strip       . polyethylene glycol (MIRALAX / GLYCOLAX) packet Take 17 g by mouth daily.      . promethazine (PHENERGAN) 25 MG tablet 25 mg every 6 (six) hours as needed for nausea or vomiting.       . ranitidine (ZANTAC) 150 MG tablet Take 150 mg by mouth 2 (two) times daily.       . simvastatin (ZOCOR) 80 MG tablet Take 80 mg by mouth daily.       . traMADol (ULTRAM) 50 MG tablet Take 50 mg by mouth 2 (two)  times daily as needed (pain).       No current facility-administered medications for this visit.    Allergies:   Review of patient's allergies indicates no known allergies.   Social History:  The patient  reports that she has never smoked. She does not have any smokeless tobacco history on file. She reports that she does not drink alcohol or use illicit drugs.   Family History:  The patient's family history includes Diabetes in her mother; Lung cancer in her sister; Ovarian cancer in her sister.   ROS:  Please see the history of present illness.   No bleeding problems.   All other systems reviewed and negative.   PHYSICAL EXAM: VS:  BP 140/80  Pulse 82  Ht 5\' 2"  (1.575 m)  Wt 206 lb (93.441 kg)  BMI 37.67 kg/m2 Well nourished, well developed, in no acute distress HEENT: normal Neck: no JVDat 90 degrees Endocrine:  No TM Cardiac:  normal S1, S2; irreg irreg; no murmur Lungs:  clear to auscultation bilaterally, no wheezing, rhonchi or rales Abd: soft, nontender, no hepatomegaly Ext: no edema Skin: warm and dry Neuro:  CNs 2-12 intact, no focal abnormalities noted  EKG:  AFib, HR 82, no change from prior tracing     ASSESSMENT AND PLAN:  1. Atrial fibrillation:  Rate is controlled.  She has remained on Eliquis consistently for at least the past 1 month or greater.  We discussed in detail the reasons for cardioversion and the procedure itself.  I answered all of her questions.  She will undergo the procedure tomorrow. 2. Hypertension:  Controlled.  3. HLD (hyperlipidemia):  Continue statin.   4. DM2 (diabetes mellitus, type 2):  Glipizide should be held while NPO. 5. Disposition:  F/u 2 weeks after DCCV.   Signed, Versie Starks, MHS 07/11/2013 11:01 AM    [facility] [place], [place], [place]  [address] Phone: [phone]; Fax: [phone]

## 2013-07-11 NOTE — Patient Instructions (Signed)
I HAVE GONE OVER YOUR CARDIOVERSION INSTRUCTIONS WITH YOU TODAY  PLEASE FOLLOW UP WITH [place], [facility] 07/31/13 @ 3 PM POST CARDIOVERSION

## 2013-07-11 NOTE — H&P (Signed)
History and Physical   Date:  07/11/2013   ID:  [name], DOB July 10, 1931, MRN [id number]  PCP:  Tivis Ringer, MD  Cardiologist:  Dr. Fransico Him      History of Present Illness: Mary Doyle is a 80 y.o. female with a hx of DM, HL, HTN, AFib, breast CA s/p L mastectomy.  Initially seen by Dr. Fransico Him for AFib in 01/2013.  She had been placed on Coumadin and this was changed to Eliquis.  Echo was obtained and this demonstrated normal LVF, mild to mod MR, mod TR and BAE.  DCCV after 4 weeks of uninterrupted anticoagulation had been discussed with the patient but she refused.  She recently contacted the office expressing interest in having the DCCV.  This is scheduled for tomorrow.  She returns for pre-procedure visit.  She is overall doing well.  She is limited by her knee DJD.  She notes NYHA 2-2b dyspnea.  Denies chest pain, syncope, orthopnea, PND, edema.     Studies:  - Echo (06/06/13):  Mild focal basal hypertrophy of the septum. EF 60% to 65%. Wall motion was normal.  Aortic sclerosis without stenosis.  Mild to moderate MR.  Mod LAE.  Mild RAE.  Mod TR.     Recent Labs: 05/01/2013: ALT 9; Creatinine 1.0; Hemoglobin 13.8; Potassium 4.5   Wt Readings from Last 3 Encounters:  07/11/13 206 lb (93.441 kg)  05/01/13 208 lb 3.2 oz (94.439 kg)  01/02/13 197 lb (89.359 kg)     Past Medical History  Diagnosis Date  . Diabetes mellitus   . Hyperparathyroidism   . Hyperlipidemia   . Glaucoma   . Lump in female breast   . Renal artery aneurysm   . Adrenal mass   . Renal artery stenosis   . Hypertension   . Breast cancer   . Atrial fibrillation, new onset     Current Outpatient Prescriptions  Medication Sig Dispense Refill  . alendronate (FOSAMAX) 70 MG tablet Take 70 mg by mouth once a week. Takes on Sundays      . amLODipine (NORVASC) 5 MG tablet Take 5 mg by mouth daily.       Marland Kitchen anastrozole (ARIMIDEX) 1 MG tablet Take 1 tablet (1 mg total) by mouth daily.   30 tablet  12  . apixaban (ELIQUIS) 5 MG TABS tablet Take 1 tablet (5 mg total) by mouth 2 (two) times daily.  60 tablet  11  . CINNAMON PO Take 1 capsule by mouth 2 (two) times daily.      . ergocalciferol (VITAMIN D2) 50000 UNITS capsule Take 50,000 Units by mouth once a week. Takes on Sundays      . fluorometholone (FML) 0.1 % ophthalmic suspension Place 1 drop into both eyes 2 (two) times daily as needed (irritation).       . fluticasone (FLONASE) 50 MCG/ACT nasal spray Place 1-2 sprays into both nostrils daily.       . furosemide (LASIX) 20 MG tablet Take 20 mg by mouth daily.      Marland Kitchen glipiZIDE (GLUCOTROL XL) 5 MG 24 hr tablet Take 5 mg by mouth 2 (two) times daily with a meal.      . hydrochlorothiazide (HYDRODIURIL) 25 MG tablet Take 25 mg by mouth daily.      Marland Kitchen latanoprost (XALATAN) 0.005 % ophthalmic solution Place 1 drop into both eyes at bedtime.       Marland Kitchen loratadine (CLARITIN) 10 MG tablet Take 10  mg by mouth daily.      Marland Kitchen LORazepam (ATIVAN) 0.5 MG tablet Take 0.5 mg by mouth daily as needed for anxiety.       Marland Kitchen losartan (COZAAR) 100 MG tablet Take 50 mg by mouth 2 (two) times daily.      . metFORMIN (GLUCOPHAGE) 1000 MG tablet Take 1,000 mg by mouth 2 (two) times daily with a meal.       . metoprolol (TOPROL-XL) 200 MG 24 hr tablet Take 200 mg by mouth daily.       . Multiple Vitamins-Minerals (CENTRUM SILVER ADULT 50+ PO) Take 1 tablet by mouth daily.       . Omega-3 Fatty Acids (FISH OIL) 1000 MG CAPS Take 1,000 mg by mouth 2 (two) times daily.      . ONE TOUCH ULTRA TEST test strip       . polyethylene glycol (MIRALAX / GLYCOLAX) packet Take 17 g by mouth daily.      . promethazine (PHENERGAN) 25 MG tablet 25 mg every 6 (six) hours as needed for nausea or vomiting.       . ranitidine (ZANTAC) 150 MG tablet Take 150 mg by mouth 2 (two) times daily.       . simvastatin (ZOCOR) 80 MG tablet Take 80 mg by mouth daily.       . traMADol (ULTRAM) 50 MG tablet Take 50 mg by mouth 2 (two)  times daily as needed (pain).       No current facility-administered medications for this visit.    Allergies:   Review of patient's allergies indicates no known allergies.   Social History:  The patient  reports that she has never smoked. She does not have any smokeless tobacco history on file. She reports that she does not drink alcohol or use illicit drugs.   Family History:  The patient's family history includes Diabetes in her mother; Lung cancer in her sister; Ovarian cancer in her sister.   ROS:  Please see the history of present illness.   No bleeding problems.   All other systems reviewed and negative.   PHYSICAL EXAM: VS:  BP 140/80  Pulse 82  Ht 5\' 2"  (1.575 m)  Wt 206 lb (93.441 kg)  BMI 37.67 kg/m2 Well nourished, well developed, in no acute distress HEENT: normal Neck: no JVDat 90 degrees Endocrine:  No TM Cardiac:  normal S1, S2; irreg irreg; no murmur Lungs:  clear to auscultation bilaterally, no wheezing, rhonchi or rales Abd: soft, nontender, no hepatomegaly Ext: no edema Skin: warm and dry Neuro:  CNs 2-12 intact, no focal abnormalities noted  EKG:  AFib, HR 82, no change from prior tracing     ASSESSMENT AND PLAN:  1. Atrial fibrillation:  Rate is controlled.  She has remained on Eliquis consistently for at least the past 1 month or greater.  We discussed in detail the reasons for cardioversion and the procedure itself.  I answered all of her questions.  She will undergo the procedure tomorrow. 2. Hypertension:  Controlled.  3. HLD (hyperlipidemia):  Continue statin.   4. DM2 (diabetes mellitus, type 2):  Glipizide should be held while NPO. 5. Disposition:  F/u 2 weeks after DCCV.   Signed, Versie Starks, MHS 07/11/2013 11:01 AM    [facility] [place], [place], [place]  [address] Phone: [phone]; Fax: [phone]

## 2013-07-12 ENCOUNTER — Encounter (HOSPITAL_COMMUNITY): Payer: Medicare Other | Admitting: Anesthesiology

## 2013-07-12 ENCOUNTER — Ambulatory Visit: Payer: Medicare Other | Admitting: Physician Assistant

## 2013-07-12 ENCOUNTER — Ambulatory Visit (HOSPITAL_COMMUNITY)
Admission: RE | Admit: 2013-07-12 | Discharge: 2013-07-12 | Disposition: A | Discharge status: Home / Self Care | Payer: Medicare Other | Source: Ambulatory Visit | Attending: Cardiology | Admitting: Cardiology

## 2013-07-12 ENCOUNTER — Ambulatory Visit (HOSPITAL_COMMUNITY): Payer: Medicare Other | Admitting: Anesthesiology

## 2013-07-12 ENCOUNTER — Encounter (HOSPITAL_COMMUNITY)
Admission: RE | Disposition: A | Discharge status: Home / Self Care | Payer: Self-pay | Source: Ambulatory Visit | Attending: Cardiology

## 2013-07-12 ENCOUNTER — Encounter (HOSPITAL_COMMUNITY): Payer: Self-pay | Admitting: Anesthesiology

## 2013-07-12 DIAGNOSIS — E785 Hyperlipidemia, unspecified: Secondary | ICD-10-CM | POA: Insufficient documentation

## 2013-07-12 DIAGNOSIS — H409 Unspecified glaucoma: Secondary | ICD-10-CM | POA: Insufficient documentation

## 2013-07-12 DIAGNOSIS — E119 Type 2 diabetes mellitus without complications: Secondary | ICD-10-CM | POA: Insufficient documentation

## 2013-07-12 DIAGNOSIS — Z853 Personal history of malignant neoplasm of breast: Secondary | ICD-10-CM | POA: Insufficient documentation

## 2013-07-12 DIAGNOSIS — I739 Peripheral vascular disease, unspecified: Secondary | ICD-10-CM | POA: Insufficient documentation

## 2013-07-12 DIAGNOSIS — I4891 Unspecified atrial fibrillation: Secondary | ICD-10-CM

## 2013-07-12 DIAGNOSIS — Z79899 Other long term (current) drug therapy: Secondary | ICD-10-CM | POA: Insufficient documentation

## 2013-07-12 DIAGNOSIS — I10 Essential (primary) hypertension: Secondary | ICD-10-CM | POA: Insufficient documentation

## 2013-07-12 DIAGNOSIS — Z9010 Acquired absence of unspecified breast and nipple: Secondary | ICD-10-CM | POA: Insufficient documentation

## 2013-07-12 DIAGNOSIS — Z7983 Long term (current) use of bisphosphonates: Secondary | ICD-10-CM | POA: Insufficient documentation

## 2013-07-12 DIAGNOSIS — Z7901 Long term (current) use of anticoagulants: Secondary | ICD-10-CM

## 2013-07-12 HISTORY — PX: CARDIOVERSION: SHX1299

## 2013-07-12 LAB — BASIC METABOLIC PANEL
BUN: 15 mg/dL (ref 6–23)
CALCIUM: 11 mg/dL — AB (ref 8.4–10.5)
CHLORIDE: 93 meq/L — AB (ref 96–112)
CO2: 31 mEq/L (ref 19–32)
CREATININE: 0.81 mg/dL (ref 0.50–1.10)
GFR calc non Af Amer: 67 mL/min — ABNORMAL LOW (ref >90)
GFR, EST AFRICAN AMERICAN: 77 mL/min — AB (ref >90)
Glucose, Bld: 199 mg/dL — ABNORMAL HIGH (ref 70–99)
Potassium: 4.2 mEq/L (ref 3.7–5.3)
SODIUM: 136 meq/L — AB (ref 137–147)

## 2013-07-12 LAB — MAGNESIUM: MAGNESIUM: 1.8 mg/dL (ref 1.5–2.5)

## 2013-07-12 LAB — GLUCOSE, CAPILLARY: GLUCOSE-CAPILLARY: 199 mg/dL — AB (ref 70–99)

## 2013-07-12 SURGERY — CARDIOVERSION
Anesthesia: General

## 2013-07-12 MED ORDER — PROPOFOL 10 MG/ML IV BOLUS
INTRAVENOUS | Status: AC
  Filled 2013-07-12: qty 40

## 2013-07-12 MED ORDER — SODIUM CHLORIDE 0.9 % IV SOLN
INTRAVENOUS | Status: DC
  Administered 2013-07-12: 12:00:00 via INTRAVENOUS

## 2013-07-12 MED ORDER — PROPOFOL 10 MG/ML IV BOLUS
INTRAVENOUS | Status: DC | PRN
  Administered 2013-07-12: 80 mg via INTRAVENOUS

## 2013-07-12 MED ORDER — LACTATED RINGERS IV SOLN
INTRAVENOUS | Status: DC | PRN
  Administered 2013-07-12: 12:00:00 via INTRAVENOUS

## 2013-07-12 NOTE — Interval H&P Note (Signed)
History and Physical Interval Note:  07/12/2013 12:42 PM  Mary Doyle  has presented today for surgery, with the diagnosis of A FIB   The various methods of treatment have been discussed with the patient and family. After consideration of risks, benefits and other options for treatment, the patient has consented to  Procedure(s): CARDIOVERSION (N/A) as a surgical intervention .  The patient's history has been reviewed, patient examined, no change in status, stable for surgery.  I have reviewed the patient's chart and labs.  Questions were answered to the patient's satisfaction.     TURNER,TRACI R

## 2013-07-12 NOTE — Anesthesia Postprocedure Evaluation (Signed)
  Anesthesia Post-op Note  Patient: Mary Doyle  Procedure(s) Performed: Procedure(s): CARDIOVERSION (N/A)  Patient Location: PACU and Endoscopy Unit  Anesthesia Type:General  Level of Consciousness: awake, alert , oriented and patient cooperative  Airway and Oxygen Therapy: Patient Spontanous Breathing and Patient connected to nasal cannula oxygen  Post-op Pain: none  Post-op Assessment: Post-op Vital signs reviewed, Patient's Cardiovascular Status Stable, Respiratory Function Stable, Patent Airway and No signs of Nausea or vomiting  Post-op Vital Signs: Reviewed and stable  Last Vitals:  Filed Vitals:   07/12/13 1253  BP:   Pulse: 55  Resp: 19    Complications: No apparent anesthesia complications

## 2013-07-12 NOTE — Transfer of Care (Signed)
Immediate Anesthesia Transfer of Care Note  Patient: Mary Doyle  Procedure(s) Performed: Procedure(s): CARDIOVERSION (N/A)  Patient Location: PACU and Endoscopy Unit  Anesthesia Type:General  Level of Consciousness: awake, alert  and patient cooperative  Airway & Oxygen Therapy: Patient Spontanous Breathing and Patient connected to nasal cannula oxygen  Post-op Assessment: Report given to PACU RN, Post -op Vital signs reviewed and stable and Patient moving all extremities  Post vital signs: Reviewed and stable  Complications: No apparent anesthesia complications

## 2013-07-12 NOTE — Discharge Instructions (Signed)
Electrical Cardioversion, Care After Refer to this sheet in the next few weeks. These instructions provide you with information on caring for yourself after your procedure. Your health care provider may also give you more specific instructions. Your treatment has been planned according to current medical practices, but problems sometimes occur. Call your health care provider if you have any problems or questions after your procedure. WHAT TO EXPECT AFTER THE PROCEDURE After your procedure, it is typical to have the following sensations:  Some redness on the skin where the shocks were delivered. If this is tender, a sunburn lotion or hydrocortisone cream may help.  Possible return of an abnormal heart rhythm within hours or days after the procedure. HOME CARE INSTRUCTIONS  Only take medicine as directed by your health care provider. Be sure you understand how and when to take your medicine.  Learn how to feel your pulse and check it often.  Limit your activity for 48 hours after the procedure or as directed.  Avoid or minimize caffeine and other stimulants as directed. SEEK MEDICAL CARE IF:  You feel like your heart is beating too fast or your pulse is not regular.  You have any questions about your medicines.  You have bleeding that will not stop. SEEK IMMEDIATE MEDICAL CARE IF:  You are dizzy or feel faint.  It is hard to breathe or you feel short of breath.  There is a change in discomfort in your chest.  Your speech is slurred or you have trouble moving an arm or leg on one side of your body.  You get a serious muscle cramp that does not go away.  Your fingers or toes turn cold or blue. MAKE SURE YOU:   Understand these instructions.   Will watch your condition.   Will get help right away if you are not doing well or get worse. Document Released: 11/09/2012 Document Reviewed: 08/03/2012 [facility] Patient Information 2014 [place], [place].

## 2013-07-12 NOTE — Anesthesia Preprocedure Evaluation (Addendum)
Anesthesia Evaluation  Patient identified by MRN, date of birth, ID band Patient awake    Reviewed: Allergy & Precautions, H&P , NPO status , Patient's Chart, lab work & pertinent test results, reviewed documented beta blocker date and time   Airway Mallampati: II TM Distance: >3 FB Neck ROM: full    Dental  (+) Lower Dentures, Upper Dentures   Pulmonary neg pulmonary ROS,  breath sounds clear to auscultation        Cardiovascular hypertension, On Medications, On Home Beta Blockers, Pt. on medications and Pt. on home beta blockers + Peripheral Vascular Disease Rhythm:regular     Neuro/Psych negative neurological ROS  negative psych ROS   GI/Hepatic negative GI ROS, Neg liver ROS,   Endo/Other  diabetes, Well Controlled, Type 2, Oral Hypoglycemic Agents  Renal/GU Renal disease  negative genitourinary   Musculoskeletal   Abdominal   Peds  Hematology negative hematology ROS (+)   Anesthesia Other Findings See surgeon's H&P   Reproductive/Obstetrics negative OB ROS                          Anesthesia Physical Anesthesia Plan  ASA: III  Anesthesia Plan: General   Post-op Pain Management:    Induction: Intravenous  Airway Management Planned: Mask  Additional Equipment:   Intra-op Plan:   Post-operative Plan:   Informed Consent: I have reviewed the patients History and Physical, chart, labs and discussed the procedure including the risks, benefits and alternatives for the proposed anesthesia with the patient or authorized representative who has indicated his/her understanding and acceptance.   Dental Advisory Given  Plan Discussed with: CRNA and Surgeon  Anesthesia Plan Comments:         Anesthesia Quick Evaluation

## 2013-07-12 NOTE — CV Procedure (Signed)
Electrical Cardioversion Procedure Note KALIA VAHEY [id number] 1931-06-04  Procedure: Electrical Cardioversion Indications:  Atrial Fibrillation  Time Out: Verified patient identification, verified procedure,medications/allergies/relevent history reviewed, required imaging and test results available.  Performed  Procedure Details  The patient was NPO after midnight. Anesthesia was administered at the beside  by Dr.Frederick with 80mg  of propofol.  Cardioversion was done with synchronized biphasic defibrillation with AP pads with 150watts.  The patient converted to normal sinus rhythm. The patient tolerated the procedure well   IMPRESSION:  Successful cardioversion of atrial fibrillation    TURNER,TRACI R 07/12/2013, 12:41 PM

## 2013-07-13 ENCOUNTER — Encounter (HOSPITAL_COMMUNITY): Payer: Self-pay | Admitting: Cardiology

## 2013-07-14 ENCOUNTER — Other Ambulatory Visit: Payer: Medicare Other

## 2013-07-14 ENCOUNTER — Encounter: Payer: Self-pay | Admitting: Cardiology

## 2013-07-14 ENCOUNTER — Ambulatory Visit (INDEPENDENT_AMBULATORY_CARE_PROVIDER_SITE_OTHER): Payer: Medicare Other

## 2013-07-14 ENCOUNTER — Telehealth: Payer: Self-pay | Admitting: Cardiology

## 2013-07-14 VITALS — BP 120/54 | HR 60 | Ht 63.0 in | Wt 204.0 lb

## 2013-07-14 DIAGNOSIS — I4891 Unspecified atrial fibrillation: Secondary | ICD-10-CM

## 2013-07-14 DIAGNOSIS — R0602 Shortness of breath: Secondary | ICD-10-CM

## 2013-07-14 LAB — BRAIN NATRIURETIC PEPTIDE: Pro B Natriuretic peptide (BNP): 577 pg/mL — ABNORMAL HIGH (ref 0.0–100.0)

## 2013-07-14 NOTE — Telephone Encounter (Signed)
New message    Patient daughter calling   Since coming off lasix on Tuesday evening -  C/O sob, slight headache. Get on husband portal oxygen tank which help.

## 2013-07-14 NOTE — Telephone Encounter (Signed)
Patient and daughter both on phone. Patient had Cardioversion on 6/10. Patient says since her cardioversion she has been experiencing moderate SOB and generalized headache. She is taking all her normal meds including Lasix. UOP wnl. States she is using her husband's portable O2 intermittently and she feels like it helps some. Headache "comes and goes" and "temporarily relieved by Tylenol".   Reviewed by Dr. Radford Pax.  Dr. Radford Pax advised she wants patient to come in for EKG today so that she can review her current status and if she is currently in NSR s/p Cardioversion. Patient's daughter notified and agreed to bring patient in this afternoon. Advised that patient should be brought in as early as possible.

## 2013-07-14 NOTE — Patient Instructions (Signed)
Continue same medications   Lab work today ( BNP )

## 2013-07-14 NOTE — Progress Notes (Signed)
1.) Reason for visit: EKG  2.) Name of MD requesting visit: Dr.Turner  3.) H&P:Recent cardioversion  4.) ROS related to problem: Patient complaining of SOB.Dr.Turner reviewed EKG which revealed NSR.Advised BNP today.  5.) Assessment and plan per MD:   Agree with above note  Fransico Him, MD 07/14/2013

## 2013-07-17 ENCOUNTER — Other Ambulatory Visit: Payer: Self-pay | Admitting: General Surgery

## 2013-07-17 ENCOUNTER — Telehealth: Payer: Self-pay | Admitting: Cardiology

## 2013-07-17 DIAGNOSIS — R0602 Shortness of breath: Secondary | ICD-10-CM

## 2013-07-17 MED ORDER — POTASSIUM CHLORIDE CRYS ER 20 MEQ PO TBCR: 20.0000 meq | EXTENDED_RELEASE_TABLET | Freq: Every day | ORAL | Status: DC

## 2013-07-17 NOTE — Telephone Encounter (Signed)
Rx sent in for pt.

## 2013-07-17 NOTE — Telephone Encounter (Signed)
Please add Kdur 78meq daily since we are increasing patient's Lasix dose

## 2013-07-21 ENCOUNTER — Other Ambulatory Visit: Payer: Medicare Other

## 2013-07-25 ENCOUNTER — Other Ambulatory Visit: Payer: Medicare Other

## 2013-07-26 ENCOUNTER — Other Ambulatory Visit (INDEPENDENT_AMBULATORY_CARE_PROVIDER_SITE_OTHER): Payer: Medicare Other

## 2013-07-26 DIAGNOSIS — R0602 Shortness of breath: Secondary | ICD-10-CM

## 2013-07-26 LAB — BASIC METABOLIC PANEL
BUN: 25 mg/dL — ABNORMAL HIGH (ref 6–23)
CALCIUM: 10.7 mg/dL — AB (ref 8.4–10.5)
CO2: 32 mEq/L (ref 19–32)
CREATININE: 1.2 mg/dL (ref 0.4–1.2)
Chloride: 92 mEq/L — ABNORMAL LOW (ref 96–112)
GFR: 48.2 mL/min — ABNORMAL LOW (ref >60.00)
GLUCOSE: 144 mg/dL — AB (ref 70–99)
Potassium: 5 mEq/L (ref 3.5–5.1)
Sodium: 132 mEq/L — ABNORMAL LOW (ref 135–145)

## 2013-07-26 LAB — TSH: TSH: 0.78 u[IU]/mL (ref 0.35–4.50)

## 2013-07-26 LAB — BRAIN NATRIURETIC PEPTIDE: Pro B Natriuretic peptide (BNP): 371 pg/mL — ABNORMAL HIGH (ref 0.0–100.0)

## 2013-07-27 ENCOUNTER — Other Ambulatory Visit: Payer: Medicare Other

## 2013-07-31 ENCOUNTER — Ambulatory Visit: Payer: Medicare Other | Admitting: Physician Assistant

## 2013-07-31 ENCOUNTER — Other Ambulatory Visit: Payer: Self-pay | Admitting: General Surgery

## 2013-07-31 DIAGNOSIS — Z79899 Other long term (current) drug therapy: Secondary | ICD-10-CM

## 2013-08-07 ENCOUNTER — Ambulatory Visit: Payer: Medicare Other | Admitting: Physician Assistant

## 2013-08-07 ENCOUNTER — Other Ambulatory Visit (INDEPENDENT_AMBULATORY_CARE_PROVIDER_SITE_OTHER): Payer: Medicare Other

## 2013-08-07 DIAGNOSIS — Z79899 Other long term (current) drug therapy: Secondary | ICD-10-CM

## 2013-08-07 LAB — BASIC METABOLIC PANEL
BUN: 18 mg/dL (ref 6–23)
CALCIUM: 10.4 mg/dL (ref 8.4–10.5)
CO2: 29 mEq/L (ref 19–32)
Chloride: 97 mEq/L (ref 96–112)
Creatinine, Ser: 0.9 mg/dL (ref 0.4–1.2)
GFR: 65.64 mL/min (ref >60.00)
GLUCOSE: 235 mg/dL — AB (ref 70–99)
Potassium: 4.4 mEq/L (ref 3.5–5.1)
Sodium: 135 mEq/L (ref 135–145)

## 2013-08-08 ENCOUNTER — Encounter: Payer: Self-pay | Admitting: General Surgery

## 2013-08-09 ENCOUNTER — Ambulatory Visit (INDEPENDENT_AMBULATORY_CARE_PROVIDER_SITE_OTHER): Payer: Medicare Other | Admitting: Physician Assistant

## 2013-08-09 ENCOUNTER — Encounter: Payer: Self-pay | Admitting: Physician Assistant

## 2013-08-09 VITALS — BP 131/84 | HR 75 | Ht 63.0 in | Wt 207.0 lb

## 2013-08-09 DIAGNOSIS — I10 Essential (primary) hypertension: Secondary | ICD-10-CM

## 2013-08-09 DIAGNOSIS — R609 Edema, unspecified: Secondary | ICD-10-CM | POA: Insufficient documentation

## 2013-08-09 DIAGNOSIS — I4891 Unspecified atrial fibrillation: Secondary | ICD-10-CM

## 2013-08-09 MED ORDER — FUROSEMIDE 40 MG PO TABS: 40.0000 mg | ORAL_TABLET | Freq: Every day | ORAL | Status: DC

## 2013-08-09 MED ORDER — POTASSIUM CHLORIDE CRYS ER 20 MEQ PO TBCR: 20.0000 meq | EXTENDED_RELEASE_TABLET | Freq: Two times a day (BID) | ORAL | Status: DC

## 2013-08-09 NOTE — Assessment & Plan Note (Signed)
Patient has increase in ankle and feet edema. Will increase her Lasix to 40 mg once daily. K-Dur 20 milliequivalents twice a day. Repeat be met in one week. 2 g sodium diet. Followup with Dr. Radford Pax in one month. I suspect she has diastolic dysfunction. 2-D echo could not evaluate this.

## 2013-08-09 NOTE — Assessment & Plan Note (Signed)
Blood pressure stable ? ?

## 2013-08-09 NOTE — Patient Instructions (Signed)
Your physician recommends that you schedule a follow-up appointment in: WITH DR. Radford Pax IN 1 MONTH  Your physician recommends that you return for lab work in: BMET IN [place] has recommended you make the following change in your medication:   INCREASE LASIX 40 MG ONCE A DAY (TAKE TWO OF YOUR 20 MG UNTIL YOU RUN OUT AND GO PICK UP THE NEW SCRIPT FROM PHARMACY)  INCREASE POTASSIUM 20 MEQ TWICE A DAY  YOUR PROVIDER WOULD LIKE TO GO ON A 2 GRAM SODIUM DIET

## 2013-08-09 NOTE — Assessment & Plan Note (Signed)
Patient underwent DC cardioversion on 07/12/13. She maintained normal sinus rhythm until the 07/17/13 when we did a repeat EKG. She does not know when she went back into atrial fibrillation and is asymptomatic with this. Continue Eliquis and metoprolol. Patient does not want another cardioversion. Followup with Dr. Radford Pax in one month.

## 2013-08-09 NOTE — Progress Notes (Signed)
HPI:  This is an 80 year old female patient Dr. Golden Hurter who underwent DC cardioversion 07/12/13 for atrial fibrillation after being on Coumadin followed by Eliquis. 2-D echo showed normal LV function mild to moderate MR, moderate TR, and biatrial enlargement. She came back to the office for an EKG on 07/17/13 that showed normal sinus rhythm. She did complain of headaches and shortness of breath. BNP was elevated at 577 so Lasix was added 40 mg twice a day for 2 days then 40 mg once daily. Patient's be met improved and she was asked to stop her HCTZ.   Patient comes in today with bilateral feet swelling that started 2 days ago. She is only on Lasix 20 mg once daily. She doesn't remember when she went on this dose. She is also back in atrial fibrillation. She is asymptomatic with this. She says she never noticed a difference after the cardioversion and really didn't feel any better. She said she won't go through that again. She does add salt to her foods.   No Known Allergies  Current Outpatient Prescriptions on File Prior to Visit: alendronate (FOSAMAX) 70 MG tablet, Take 70 mg by mouth once a week. Takes on Sundays, Disp: , Rfl:  amLODipine (NORVASC) 5 MG tablet, Take 5 mg by mouth daily. , Disp: , Rfl:  anastrozole (ARIMIDEX) 1 MG tablet, Take 1 tablet (1 mg total) by mouth daily., Disp: 30 tablet, Rfl: 12 apixaban (ELIQUIS) 5 MG TABS tablet, Take 1 tablet (5 mg total) by mouth 2 (two) times daily., Disp: 60 tablet, Rfl: 11 CINNAMON PO, Take 1 capsule by mouth 2 (two) times daily., Disp: , Rfl:  ergocalciferol (VITAMIN D2) 50000 UNITS capsule, Take 50,000 Units by mouth once a week. Takes on Sundays, Disp: , Rfl:  fluorometholone (FML) 0.1 % ophthalmic suspension, Place 1 drop into both eyes 2 (two) times daily as needed (irritation). , Disp: , Rfl:  fluticasone (FLONASE) 50 MCG/ACT nasal spray, Place 1-2 sprays into both nostrils daily. , Disp: , Rfl:  furosemide (LASIX) 20 MG tablet, Take 20  mg by mouth daily., Disp: , Rfl:  glipiZIDE (GLUCOTROL XL) 5 MG 24 hr tablet, Take 5 mg by mouth 2 (two) times daily with a meal., Disp: , Rfl:  latanoprost (XALATAN) 0.005 % ophthalmic solution, Place 1 drop into both eyes at bedtime. , Disp: , Rfl:  loratadine (CLARITIN) 10 MG tablet, Take 10 mg by mouth daily., Disp: , Rfl:  LORazepam (ATIVAN) 0.5 MG tablet, Take 0.5 mg by mouth daily as needed for anxiety. , Disp: , Rfl:  losartan (COZAAR) 100 MG tablet, Take 50 mg by mouth 2 (two) times daily., Disp: , Rfl:  metFORMIN (GLUCOPHAGE) 1000 MG tablet, Take 1,000 mg by mouth 2 (two) times daily with a meal. , Disp: , Rfl:  metoprolol (TOPROL-XL) 200 MG 24 hr tablet, Take 200 mg by mouth daily. , Disp: , Rfl:  Multiple Vitamins-Minerals (CENTRUM SILVER ADULT 50+ PO), Take 1 tablet by mouth daily. , Disp: , Rfl:  Omega-3 Fatty Acids (FISH OIL) 1000 MG CAPS, Take 1,000 mg by mouth 2 (two) times daily., Disp: , Rfl:  ONE TOUCH ULTRA TEST test strip, , Disp: , Rfl:  polyethylene glycol (MIRALAX / GLYCOLAX) packet, Take 17 g by mouth daily., Disp: , Rfl:  potassium chloride SA (K-DUR,KLOR-CON) 20 MEQ tablet, Take 1 tablet (20 mEq total) by mouth daily. Take daily with lasix, Disp: 90 tablet, Rfl: 3 promethazine (PHENERGAN) 25 MG tablet, 25 mg every 6 (six) hours  as needed for nausea or vomiting. , Disp: , Rfl:  ranitidine (ZANTAC) 150 MG tablet, Take 150 mg by mouth 2 (two) times daily. , Disp: , Rfl:  simvastatin (ZOCOR) 80 MG tablet, Take 80 mg by mouth daily. , Disp: , Rfl:   traMADol (ULTRAM) 50 MG tablet, Take 50 mg by mouth 2 (two) times daily as needed (pain)., Disp: , Rfl:   No current facility-administered medications on file prior to visit.   Past Medical History:   Diabetes mellitus                                            Hyperparathyroidism                                          Hyperlipidemia                                               Glaucoma                                                      Lump in female breast                                        Renal artery aneurysm                                        Adrenal mass                                                 Renal artery stenosis                                        Hypertension                                                 Breast cancer                                                Atrial fibrillation, new onset                              Past Surgical History:   BREAST SURGERY  Comment:masectomy - left   CATARACT EXTRACTION                                           CARDIOVERSION                                   N/A 07/12/2013      Comment:Procedure: CARDIOVERSION;  Surgeon: Sueanne Margarita, MD;  Location: [facility];  Service:               Cardiovascular;  Laterality: N/A;  Review of patient's family history indicates:   Diabetes                       Mother                   Lung cancer                    Sister                   Ovarian cancer                 Sister                   Social History   Marital Status: Married             Spouse Name:                      Years of Education:                 Number of children:             Occupational History   None on file  Social History Main Topics   Smoking Status: Never Smoker                     Smokeless Status: Not on file                      Alcohol Use: No             Drug Use: No             Sexual Activity: Not Currently      Other Topics            Concern   None on file  Social History Narrative   None on file    ROS: See history of present illness otherwise negative  PHYSICAL EXAM: Well-nournished, in no acute distress. Neck: No JVD, HJR, Bruit, or thyroid enlargement  Lungs: Decreased breath sounds but No tachypnea, clear without wheezing, rales, or rhonchi  Cardiovascular: Irregular irregular, PMI not displaced, heart sounds distant,  2/6 systolic murmur at the left sternal border, no gallops, bruit, thrill, or heave.  Abdomen: BS normal. Soft without organomegaly, masses, lesions or tenderness.  Extremities: +1-2 edema in her feet and ankles bilaterally otherwise lower extremities without cyanosis, clubbing. Good distal pulses bilateral  SKin: Warm, no lesions or rashes   Musculoskeletal: No deformities  Neuro: no focal signs  BP 131/84  Pulse 75  Ht _0  (1.6 m)  Wt  207 lb (93.895 kg)  BMI 36.68 kg/m2    EKG: Atrial fibrillation at 75 beats per minute  2-D echo 06/06/13 Study Conclusions  - Left ventricle: The cavity size was normal. There was mild   focal basal hypertrophy of the septum. Systolic function   was normal. The estimated ejection fraction was in the   range of 60% to 65%. Wall motion was normal; there were no   regional wall motion abnormalities. The study is not   technically sufficient to allow evaluation of LV diastolic   function. - Aortic valve: Trileaflet; mildly thickened, mildly   calcified leaflets. Sclerosis without stenosis.   Transvalvular velocity was within the normal range. There   was no stenosis. No regurgitation. - Mitral valve: Calcified annulus. Mildly thickened leaflets   . Mild to moderate regurgitation. - Left atrium: The atrium was moderately dilated. - Right ventricle: Systolic function was normal. - Right atrium: The atrium was mildly dilated. - Tricuspid valve: Moderate regurgitation. - Pulmonary arteries: Systolic pressure was within the   normal range

## 2013-08-16 ENCOUNTER — Other Ambulatory Visit (INDEPENDENT_AMBULATORY_CARE_PROVIDER_SITE_OTHER): Payer: Medicare Other

## 2013-08-16 DIAGNOSIS — I4891 Unspecified atrial fibrillation: Secondary | ICD-10-CM

## 2013-08-16 DIAGNOSIS — I10 Essential (primary) hypertension: Secondary | ICD-10-CM

## 2013-08-16 LAB — BASIC METABOLIC PANEL
BUN: 17 mg/dL (ref 6–23)
CHLORIDE: 95 meq/L — AB (ref 96–112)
CO2: 32 meq/L (ref 19–32)
Calcium: 10.4 mg/dL (ref 8.4–10.5)
Creatinine, Ser: 1 mg/dL (ref 0.4–1.2)
GFR: 59.36 mL/min — ABNORMAL LOW (ref >60.00)
Glucose, Bld: 194 mg/dL — ABNORMAL HIGH (ref 70–99)
POTASSIUM: 4.7 meq/L (ref 3.5–5.1)
Sodium: 134 mEq/L — ABNORMAL LOW (ref 135–145)

## 2013-09-11 ENCOUNTER — Ambulatory Visit: Payer: Medicare Other | Admitting: Cardiology

## 2013-10-18 ENCOUNTER — Ambulatory Visit: Payer: Medicare Other | Admitting: Cardiology

## 2013-10-20 ENCOUNTER — Ambulatory Visit: Payer: Medicare Other | Admitting: Cardiology

## 2013-11-17 ENCOUNTER — Telehealth: Payer: Self-pay | Admitting: Hematology and Oncology

## 2013-11-17 NOTE — Telephone Encounter (Signed)
S/w pt's daughter Opal Sidles to advise of appt chg on 3/28 due to md pal. Pt's daughter Opal Sidles advised that she will call back in Jan 2016 to schedule appt. 3/28 appt cx'd and not r/s'd at [place] req.

## 2013-12-13 ENCOUNTER — Ambulatory Visit: Payer: Medicare Other | Admitting: Cardiology

## 2013-12-15 ENCOUNTER — Other Ambulatory Visit: Payer: Self-pay | Admitting: *Deleted

## 2013-12-15 MED ORDER — APIXABAN 5 MG PO TABS: 5.0000 mg | ORAL_TABLET | Freq: Two times a day (BID) | ORAL | Status: DC

## 2013-12-18 ENCOUNTER — Other Ambulatory Visit: Payer: Self-pay | Admitting: *Deleted

## 2013-12-18 DIAGNOSIS — I4891 Unspecified atrial fibrillation: Secondary | ICD-10-CM

## 2013-12-18 DIAGNOSIS — I10 Essential (primary) hypertension: Secondary | ICD-10-CM

## 2013-12-18 MED ORDER — POTASSIUM CHLORIDE CRYS ER 20 MEQ PO TBCR: 20.0000 meq | EXTENDED_RELEASE_TABLET | Freq: Two times a day (BID) | ORAL | Status: DC

## 2013-12-18 MED ORDER — FUROSEMIDE 40 MG PO TABS
40.0000 mg | ORAL_TABLET | Freq: Every day | ORAL | Status: DC
Start: 2013-12-18 — End: 2018-03-21

## 2014-04-30 ENCOUNTER — Other Ambulatory Visit: Payer: Medicare Other

## 2014-04-30 ENCOUNTER — Ambulatory Visit: Payer: Medicare Other | Admitting: Hematology and Oncology

## 2014-05-14 ENCOUNTER — Other Ambulatory Visit: Payer: Self-pay | Admitting: Oncology

## 2014-05-14 ENCOUNTER — Telehealth: Payer: Self-pay | Admitting: Hematology and Oncology

## 2014-05-14 ENCOUNTER — Other Ambulatory Visit: Payer: Self-pay

## 2014-05-14 ENCOUNTER — Other Ambulatory Visit: Payer: Self-pay | Admitting: *Deleted

## 2014-05-14 DIAGNOSIS — C50919 Malignant neoplasm of unspecified site of unspecified female breast: Secondary | ICD-10-CM

## 2014-05-14 DIAGNOSIS — I10 Essential (primary) hypertension: Secondary | ICD-10-CM

## 2014-05-14 DIAGNOSIS — I4891 Unspecified atrial fibrillation: Secondary | ICD-10-CM

## 2014-05-14 MED ORDER — POTASSIUM CHLORIDE CRYS ER 20 MEQ PO TBCR: 20.0000 meq | EXTENDED_RELEASE_TABLET | Freq: Two times a day (BID) | ORAL | Status: DC

## 2014-05-14 MED ORDER — ANASTROZOLE 1 MG PO TABS: 1.0000 mg | ORAL_TABLET | Freq: Every day | ORAL | Status: DC

## 2014-05-14 NOTE — Telephone Encounter (Signed)
Called and left a message to call for a follow up appointment

## 2014-05-15 ENCOUNTER — Telehealth: Payer: Self-pay | Admitting: Hematology and Oncology

## 2014-05-15 NOTE — Telephone Encounter (Signed)
returned call adn s.w dtr and r/s cx appt...ok adn aware

## 2014-06-05 ENCOUNTER — Other Ambulatory Visit: Payer: Self-pay | Admitting: *Deleted

## 2014-06-05 DIAGNOSIS — C50919 Malignant neoplasm of unspecified site of unspecified female breast: Secondary | ICD-10-CM

## 2014-06-06 ENCOUNTER — Telehealth: Payer: Self-pay | Admitting: Hematology and Oncology

## 2014-06-06 ENCOUNTER — Ambulatory Visit (HOSPITAL_BASED_OUTPATIENT_CLINIC_OR_DEPARTMENT_OTHER): Payer: Medicare Other | Admitting: Hematology and Oncology

## 2014-06-06 ENCOUNTER — Other Ambulatory Visit (HOSPITAL_BASED_OUTPATIENT_CLINIC_OR_DEPARTMENT_OTHER): Payer: Medicare Other

## 2014-06-06 VITALS — BP 152/77 | HR 74 | Temp 97.8°F | Resp 18 | Ht 63.0 in | Wt 205.4 lb

## 2014-06-06 DIAGNOSIS — Z170 Estrogen receptor positive status [ER+]: Secondary | ICD-10-CM

## 2014-06-06 DIAGNOSIS — C50912 Malignant neoplasm of unspecified site of left female breast: Secondary | ICD-10-CM | POA: Diagnosis not present

## 2014-06-06 DIAGNOSIS — C50919 Malignant neoplasm of unspecified site of unspecified female breast: Secondary | ICD-10-CM

## 2014-06-06 LAB — CBC WITH DIFFERENTIAL/PLATELET
BASO%: 0.7 % (ref 0.0–2.0)
Basophils Absolute: 0 10*3/uL (ref 0.0–0.1)
EOS%: 5.7 % (ref 0.0–7.0)
Eosinophils Absolute: 0.4 10*3/uL (ref 0.0–0.5)
HEMATOCRIT: 43.1 % (ref 34.8–46.6)
HEMOGLOBIN: 13.8 g/dL (ref 11.6–15.9)
LYMPH%: 26 % (ref 14.0–49.7)
MCH: 29.4 pg (ref 25.1–34.0)
MCHC: 32.1 g/dL (ref 31.5–36.0)
MCV: 91.6 fL (ref 79.5–101.0)
MONO#: 0.6 10*3/uL (ref 0.1–0.9)
MONO%: 9.5 % (ref 0.0–14.0)
NEUT#: 3.8 10*3/uL (ref 1.5–6.5)
NEUT%: 58.1 % (ref 38.4–76.8)
Platelets: 171 10*3/uL (ref 145–400)
RBC: 4.7 10*6/uL (ref 3.70–5.45)
RDW: 13.4 % (ref 11.2–14.5)
WBC: 6.5 10*3/uL (ref 3.9–10.3)
lymph#: 1.7 10*3/uL (ref 0.9–3.3)

## 2014-06-06 LAB — COMPREHENSIVE METABOLIC PANEL (CC13)
ALT: 13 U/L (ref 0–55)
AST: 14 U/L (ref 5–34)
Albumin: 3.7 g/dL (ref 3.5–5.0)
Alkaline Phosphatase: 59 U/L (ref 40–150)
Anion Gap: 10 mEq/L (ref 3–11)
BILIRUBIN TOTAL: 0.67 mg/dL (ref 0.20–1.20)
BUN: 15.8 mg/dL (ref 7.0–26.0)
CO2: 29 mEq/L (ref 22–29)
Calcium: 10.9 mg/dL — ABNORMAL HIGH (ref 8.4–10.4)
Chloride: 98 mEq/L (ref 98–109)
Creatinine: 1 mg/dL (ref 0.6–1.1)
EGFR: 51 mL/min/{1.73_m2} — ABNORMAL LOW (ref >90)
Glucose: 306 mg/dl — ABNORMAL HIGH (ref 70–140)
Potassium: 5 mEq/L (ref 3.5–5.1)
Sodium: 137 mEq/L (ref 136–145)
Total Protein: 6.5 g/dL (ref 6.4–8.3)

## 2014-06-06 NOTE — Telephone Encounter (Signed)
One year appointment mailed  With a calendar and letter   Webb Silversmith

## 2014-06-06 NOTE — Progress Notes (Signed)
Patient Care Team: Prince Solian, MD as PCP - General (Internal Medicine)  DIAGNOSIS: 81 year old with 4.3 cm left breast cancer, ER+ s/p left breast mastectomy   SUMMARY OF ONCOLOGIC HISTORY: 1.S/p left breast mastectomy with SNL , final path showed a well-differentiated IDC measuring 4.3 cm  2. Now on arimidex 1 mg po daily   CURRENT THERAPY:arimidex 1 mg daily since December 2012  CHIEF COMPLIANT: Follow-up on Arimidex  INTERVAL HISTORY: Mary Doyle is a 81 year old lady with above-mentioned history of left breast cancer treated with mastectomy followed by Arimidex therapy since December 2012. She is tolerating Arimidex extremely well without any major problems. 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 She's otherwise been healthy. Denies any lumps or nodules in the breasts.  REVIEW OF SYSTEMS:   Constitutional: Denies fevers, chills or abnormal weight loss Eyes: Denies blurriness of vision Ears, nose, mouth, throat, and face: Denies mucositis or sore throat Respiratory: Denies cough, dyspnea or wheezes Cardiovascular: Denies palpitation, chest discomfort or lower extremity swelling Gastrointestinal:  Denies nausea, heartburn or change in bowel habits Skin: Denies abnormal skin rashes Lymphatics: Denies new lymphadenopathy or easy bruising Neurological:Denies numbness, tingling or new weaknesses Behavioral/Psych: Mood is stable, no new changes  Breast:  denies any pain or lumps or nodules in either breasts All other systems were reviewed with the patient and are negative.  I have reviewed the past medical history, past surgical history, social history and family history with the patient and they are unchanged from previous note.  ALLERGIES:  has No Known Allergies.  MEDICATIONS:  Current Outpatient Prescriptions  Medication Sig Dispense Refill  . alendronate (FOSAMAX) 70 MG tablet Take 70 mg by mouth once a week. Takes on Sundays    .  amLODipine (NORVASC) 5 MG tablet Take 5 mg by mouth daily.     Marland Kitchen anastrozole (ARIMIDEX) 1 MG tablet Take 1 tablet (1 mg total) by mouth daily. 30 tablet 0  . apixaban (ELIQUIS) 5 MG TABS tablet Take 1 tablet (5 mg total) by mouth 2 (two) times daily. 60 tablet 0  . atorvastatin (LIPITOR) 40 MG tablet     . CINNAMON PO Take 1 capsule by mouth 2 (two) times daily.    . ergocalciferol (VITAMIN D2) 50000 UNITS capsule Take 50,000 Units by mouth once a week. Takes on Sundays    . fluorometholone (FML) 0.1 % ophthalmic suspension Place 1 drop into both eyes 2 (two) times daily as needed (irritation).     . fluticasone (FLONASE) 50 MCG/ACT nasal spray Place 1-2 sprays into both nostrils daily.     . furosemide (LASIX) 40 MG tablet Take 1 tablet (40 mg total) by mouth daily. 30 tablet 1  . glipiZIDE (GLUCOTROL XL) 5 MG 24 hr tablet Take 5 mg by mouth 2 (two) times daily with a meal.    . latanoprost (XALATAN) 0.005 % ophthalmic solution Place 1 drop into both eyes at bedtime.     Marland Kitchen loratadine (CLARITIN) 10 MG tablet Take 10 mg by mouth daily.    Marland Kitchen LORazepam (ATIVAN) 0.5 MG tablet Take 0.5 mg by mouth daily as needed for anxiety.     Marland Kitchen losartan (COZAAR) 100 MG tablet Take 50 mg by mouth 2 (two) times daily.    . metFORMIN (GLUCOPHAGE) 1000 MG tablet Take 1,000 mg by mouth 2 (two) times daily with a meal.     . metoprolol (TOPROL-XL) 200 MG 24 hr tablet Take  200 mg by mouth daily.     . Multiple Vitamins-Minerals (CENTRUM SILVER ADULT 50+ PO) Take 1 tablet by mouth daily.     . Omega-3 Fatty Acids (FISH OIL) 1000 MG CAPS Take 1,000 mg by mouth 2 (two) times daily.    . ONE TOUCH ULTRA TEST test strip     . polyethylene glycol (MIRALAX / GLYCOLAX) packet Take 17 g by mouth daily.    . potassium chloride SA (K-DUR,KLOR-CON) 20 MEQ tablet Take 1 tablet (20 mEq total) by mouth 2 (two) times daily. 60 tablet 0  . promethazine (PHENERGAN) 25 MG tablet 25 mg every 6 (six) hours as needed for nausea or vomiting.      . ranitidine (ZANTAC) 150 MG tablet Take 150 mg by mouth 2 (two) times daily.     . traMADol (ULTRAM) 50 MG tablet Take 50 mg by mouth 2 (two) times daily as needed (pain).     No current facility-administered medications for this visit.    PHYSICAL EXAMINATION: ECOG PERFORMANCE STATUS: 1 - Symptomatic but completely ambulatory  Filed Vitals:   06/06/14 1137  BP: 152/77  Pulse: 74  Temp: 97.8 F (36.6 C)  Resp: 18   Filed Weights   06/06/14 1137  Weight: 205 lb 6.4 oz (93.169 kg)    GENERAL:alert, no distress and comfortable SKIN: skin color, texture, turgor are normal, no rashes or significant lesions EYES: normal, Conjunctiva are pink and non-injected, sclera clear OROPHARYNX:no exudate, no erythema and lips, buccal mucosa, and tongue normal  NECK: supple, thyroid normal size, non-tender, without nodularity LYMPH:  no palpable lymphadenopathy in the cervical, axillary or inguinal LUNGS: clear to auscultation and percussion with normal breathing effort HEART: regular rate & rhythm and no murmurs and no lower extremity edema ABDOMEN:abdomen soft, non-tender and normal bowel sounds Musculoskeletal:no cyanosis of digits and no clubbing  NEURO: alert & oriented x 3 with fluent speech, no focal motor/sensory deficits BREAST: No palpable lumps or nodules in the left chest wall or axilla, no palpable lumps or nodules in the right breast. (exam performed in the presence of a chaperone)  LABORATORY DATA:  I have reviewed the data as listed   Chemistry      Component Value Date/Time   NA 134* 08/16/2013 1055   NA 134* 05/01/2013 1102   K 4.7 08/16/2013 1055   K 4.5 05/01/2013 1102   CL 95* 08/16/2013 1055   CL 94* 04/12/2012 1417   CO2 32 08/16/2013 1055   CO2 32* 05/01/2013 1102   BUN 17 08/16/2013 1055   BUN 16.6 05/01/2013 1102   CREATININE 1.0 08/16/2013 1055   CREATININE 1.0 05/01/2013 1102      Component Value Date/Time   CALCIUM 10.4 08/16/2013 1055   CALCIUM  10.6* 05/01/2013 1102   CALCIUM 12.0* 09/25/2010 0000   ALKPHOS 61 05/01/2013 1102   ALKPHOS 38* 09/17/2011 1145   AST 14 05/01/2013 1102   AST 17 09/17/2011 1145   ALT 9 05/01/2013 1102   ALT 14 09/17/2011 1145   BILITOT 0.67 05/01/2013 1102   BILITOT 0.8 09/17/2011 1145       Lab Results  Component Value Date   WBC 6.5 06/06/2014   HGB 13.8 06/06/2014   HCT 43.1 06/06/2014   MCV 91.6 06/06/2014   PLT 171 06/06/2014   NEUTROABS 3.8 06/06/2014     RADIOGRAPHIC STUDIES: I have personally reviewed the radiology reports and agreed with their findings. Mammograms October 2015 Normal on the right breast  ASSESSMENT & PLAN:  Left breast cancer treated with mastectomy and sentinel lymph node well-differentiated invasive ductal carcinoma 4.3 cm T2 N0 M0 stage II a current and Arimidex 1 mg daily since December 2012  Arimidex toxicities: No major side effects Arimidex.  Breast cancer surveillance: 1. Mammograms to be done October 2016 2. Breast exam 06/06/2014 is normal  Return to clinic in 1 year for follow-up. We will set it up for November 2017 because she gets mammograms and blood work prior to that. We do not need to obtain blood work again since she gets her blood work through her primary care physician.  No orders of the defined types were placed in this encounter.   The patient has a good understanding of the overall plan. she agrees with it. she will call with any problems that may develop before the next visit here.   Rulon Eisenmenger, MD

## 2014-06-21 ENCOUNTER — Other Ambulatory Visit: Payer: Self-pay | Admitting: Oncology

## 2014-06-21 ENCOUNTER — Other Ambulatory Visit: Payer: Self-pay | Admitting: *Deleted

## 2014-06-21 DIAGNOSIS — C50919 Malignant neoplasm of unspecified site of unspecified female breast: Secondary | ICD-10-CM

## 2014-06-21 MED ORDER — ANASTROZOLE 1 MG PO TABS: 1.0000 mg | ORAL_TABLET | Freq: Every day | ORAL | Status: DC

## 2014-07-11 ENCOUNTER — Other Ambulatory Visit: Payer: Self-pay

## 2014-07-11 DIAGNOSIS — I10 Essential (primary) hypertension: Secondary | ICD-10-CM

## 2014-07-11 DIAGNOSIS — I4891 Unspecified atrial fibrillation: Secondary | ICD-10-CM

## 2014-07-11 MED ORDER — POTASSIUM CHLORIDE CRYS ER 20 MEQ PO TBCR: 20.0000 meq | EXTENDED_RELEASE_TABLET | Freq: Two times a day (BID) | ORAL | Status: DC

## 2014-08-03 ENCOUNTER — Other Ambulatory Visit: Payer: Self-pay | Admitting: *Deleted

## 2014-08-03 DIAGNOSIS — I4891 Unspecified atrial fibrillation: Secondary | ICD-10-CM

## 2014-08-03 DIAGNOSIS — I10 Essential (primary) hypertension: Secondary | ICD-10-CM

## 2014-08-03 MED ORDER — POTASSIUM CHLORIDE CRYS ER 20 MEQ PO TBCR: 20.0000 meq | EXTENDED_RELEASE_TABLET | Freq: Two times a day (BID) | ORAL | Status: DC

## 2014-12-11 ENCOUNTER — Other Ambulatory Visit: Payer: Self-pay | Admitting: *Deleted

## 2014-12-11 DIAGNOSIS — C50919 Malignant neoplasm of unspecified site of unspecified female breast: Secondary | ICD-10-CM

## 2014-12-11 MED ORDER — ANASTROZOLE 1 MG PO TABS: 1.0000 mg | ORAL_TABLET | Freq: Every day | ORAL | Status: DC

## 2014-12-12 ENCOUNTER — Other Ambulatory Visit: Payer: Self-pay

## 2014-12-12 DIAGNOSIS — C50919 Malignant neoplasm of unspecified site of unspecified female breast: Secondary | ICD-10-CM

## 2014-12-12 MED ORDER — ANASTROZOLE 1 MG PO TABS: 1.0000 mg | ORAL_TABLET | Freq: Every day | ORAL | Status: DC

## 2015-11-13 ENCOUNTER — Other Ambulatory Visit: Payer: Self-pay | Admitting: Hematology and Oncology

## 2015-11-13 DIAGNOSIS — C50919 Malignant neoplasm of unspecified site of unspecified female breast: Secondary | ICD-10-CM

## 2015-12-04 NOTE — Assessment & Plan Note (Deleted)
Left breast cancer treated with mastectomy and sentinel lymph node well-differentiated invasive ductal carcinoma 4.3 cm T2 N0 M0 stage II a current and Arimidex 1 mg daily since December 2012  Arimidex toxicities: No major side effects Arimidex.  Breast cancer surveillance: 1. Mammograms to be done October 2016 2. Breast exam 12/05/15 is normal  Return to clinic in 1 year for follow-up.

## 2015-12-05 ENCOUNTER — Telehealth: Payer: Self-pay | Admitting: Hematology and Oncology

## 2015-12-05 ENCOUNTER — Ambulatory Visit: Payer: Medicare Other | Admitting: Hematology and Oncology

## 2015-12-05 NOTE — Telephone Encounter (Signed)
Patient called to reschedule appointment. 12/05/15

## 2015-12-30 NOTE — Assessment & Plan Note (Signed)
Left breast cancer treated with mastectomy and sentinel lymph node well-differentiated invasive ductal carcinoma 4.3 cm T2 N0 M0 stage II a current and Arimidex 1 mg daily since December 2012  Arimidex toxicities: No major side effects Arimidex.  Breast cancer surveillance: 1. Mammograms to be done October 2016 2. Breast exam 12/31/15 is normal  Return to clinic in 1 year for follow-up

## 2015-12-31 ENCOUNTER — Encounter: Payer: Self-pay | Admitting: Hematology and Oncology

## 2015-12-31 ENCOUNTER — Ambulatory Visit (HOSPITAL_BASED_OUTPATIENT_CLINIC_OR_DEPARTMENT_OTHER): Payer: Medicare Other | Admitting: Hematology and Oncology

## 2015-12-31 DIAGNOSIS — Z170 Estrogen receptor positive status [ER+]: Secondary | ICD-10-CM

## 2015-12-31 DIAGNOSIS — Z79811 Long term (current) use of aromatase inhibitors: Secondary | ICD-10-CM | POA: Diagnosis not present

## 2015-12-31 DIAGNOSIS — C50412 Malignant neoplasm of upper-outer quadrant of left female breast: Secondary | ICD-10-CM | POA: Diagnosis not present

## 2015-12-31 NOTE — Progress Notes (Signed)
Patient Care Team: Prince Solian, MD as PCP - General (Internal Medicine)  DIAGNOSIS:  Encounter Diagnosis  Name Primary?  . Malignant neoplasm of upper-outer quadrant of left breast in female, estrogen receptor positive ([place])    SUMMARY OF ONCOLOGIC HISTORY: 1.S/p left breast mastectomy with SNL , final path showed a well-differentiated IDC measuring 4.3 cm  2. Now on arimidex 1 mg po daily   CURRENT THERAPY:arimidex 1 mg daily since December 2012  CHIEF COMPLIANT: Follow-up on Arimidex  INTERVAL HISTORY: Mary Doyle is a 82 year old with above-mentioned history of left breast cancer treated with mastectomy and is currently on Arimidex therapy since December 2012. She will finish 5 years of Arimidex therapy as of this December. She reports to be tolerating it extremely well without any major problems or concerns. She denies any lumps or nodules in breast.  REVIEW OF SYSTEMS:   Constitutional: Denies fevers, chills or abnormal weight loss Eyes: Denies blurriness of vision Ears, nose, mouth, throat, and face: Denies mucositis or sore throat Respiratory: Denies cough, dyspnea or wheezes Cardiovascular: Denies palpitation, chest discomfort Gastrointestinal:  Denies nausea, heartburn or change in bowel habits Skin: Denies abnormal skin rashes Lymphatics: Denies new lymphadenopathy or easy bruising Neurological:Denies numbness, tingling or new weaknesses Behavioral/Psych: Mood is stable, no new changes  Extremities: No lower extremity edema Breast:  denies any pain or lumps or nodules in either breasts All other systems were reviewed with the patient and are negative.  I have reviewed the past medical history, past surgical history, social history and family history with the patient and they are unchanged from previous note.  ALLERGIES:  has No Known Allergies.  MEDICATIONS:  Current Outpatient Prescriptions  Medication Sig Dispense Refill  . alendronate (FOSAMAX) 70  MG tablet Take 70 mg by mouth once a week. Takes on Sundays    . amLODipine (NORVASC) 5 MG tablet Take 5 mg by mouth daily.     Marland Kitchen anastrozole (ARIMIDEX) 1 MG tablet TAKE 1 TABLET BY MOUTH ONCE A DAY 90 tablet 3  . apixaban (ELIQUIS) 5 MG TABS tablet Take 1 tablet (5 mg total) by mouth 2 (two) times daily. 60 tablet 0  . atorvastatin (LIPITOR) 40 MG tablet     . CINNAMON PO Take 1 capsule by mouth 2 (two) times daily.    . ergocalciferol (VITAMIN D2) 50000 UNITS capsule Take 50,000 Units by mouth once a week. Takes on Sundays    . fluorometholone (FML) 0.1 % ophthalmic suspension Place 1 drop into both eyes 2 (two) times daily as needed (irritation).     . fluticasone (FLONASE) 50 MCG/ACT nasal spray Place 1-2 sprays into both nostrils daily.     . furosemide (LASIX) 40 MG tablet Take 1 tablet (40 mg total) by mouth daily. 30 tablet 1  . glipiZIDE (GLUCOTROL XL) 5 MG 24 hr tablet Take 5 mg by mouth 2 (two) times daily with a meal.    . latanoprost (XALATAN) 0.005 % ophthalmic solution Place 1 drop into both eyes at bedtime.     Marland Kitchen loratadine (CLARITIN) 10 MG tablet Take 10 mg by mouth daily.    Marland Kitchen LORazepam (ATIVAN) 0.5 MG tablet Take 0.5 mg by mouth daily as needed for anxiety.     Marland Kitchen losartan (COZAAR) 100 MG tablet Take 50 mg by mouth 2 (two) times daily.    . metFORMIN (GLUCOPHAGE) 1000 MG tablet Take 1,000 mg by mouth 2 (two) times daily with a meal.     .  metoprolol (TOPROL-XL) 200 MG 24 hr tablet Take 200 mg by mouth daily.     . Multiple Vitamins-Minerals (CENTRUM SILVER ADULT 50+ PO) Take 1 tablet by mouth daily.     . Omega-3 Fatty Acids (FISH OIL) 1000 MG CAPS Take 1,000 mg by mouth 2 (two) times daily.    . ONE TOUCH ULTRA TEST test strip     . polyethylene glycol (MIRALAX / GLYCOLAX) packet Take 17 g by mouth daily.    . potassium chloride SA (K-DUR,KLOR-CON) 20 MEQ tablet Take 1 tablet (20 mEq total) by mouth 2 (two) times daily. 30 tablet 0  . promethazine (PHENERGAN) 25 MG tablet 25  mg every 6 (six) hours as needed for nausea or vomiting.     . ranitidine (ZANTAC) 150 MG tablet Take 150 mg by mouth 2 (two) times daily.     . traMADol (ULTRAM) 50 MG tablet Take 50 mg by mouth 2 (two) times daily as needed (pain).     No current facility-administered medications for this visit.     PHYSICAL EXAMINATION: ECOG PERFORMANCE STATUS: 0 - Asymptomatic  Vitals:   12/31/15 1215  BP: (!) 156/98  Pulse: 87  Resp: 18  Temp: 98.9 F (37.2 C)   Filed Weights   12/31/15 1215  Weight: 193 lb 11.2 oz (87.9 kg)    GENERAL:alert, no distress and comfortable SKIN: skin color, texture, turgor are normal, no rashes or significant lesions EYES: normal, Conjunctiva are pink and non-injected, sclera clear OROPHARYNX:no exudate, no erythema and lips, buccal mucosa, and tongue normal  NECK: supple, thyroid normal size, non-tender, without nodularity LYMPH:  no palpable lymphadenopathy in the cervical, axillary or inguinal LUNGS: clear to auscultation and percussion with normal breathing effort HEART: regular rate & rhythm and no murmurs and no lower extremity edema ABDOMEN:abdomen soft, non-tender and normal bowel sounds MUSCULOSKELETAL:no cyanosis of digits and no clubbing  NEURO: alert & oriented x 3 with fluent speech, no focal motor/sensory deficits EXTREMITIES: No lower extremity edema BREAST: No palpable masses or nodules in either right or left breasts. No palpable axillary supraclavicular or infraclavicular adenopathy no breast tenderness or nipple discharge. (exam performed in the presence of a chaperone)  LABORATORY DATA:  I have reviewed the data as listed   Chemistry      Component Value Date/Time   NA 137 06/06/2014 1113   K 5.0 06/06/2014 1113   CL 95 (L) 08/16/2013 1055   CL 94 (L) 04/12/2012 1417   CO2 29 06/06/2014 1113   BUN 15.8 06/06/2014 1113   CREATININE 1.0 06/06/2014 1113      Component Value Date/Time   CALCIUM 10.9 (H) 06/06/2014 1113   ALKPHOS  59 06/06/2014 1113   AST 14 06/06/2014 1113   ALT 13 06/06/2014 1113   BILITOT 0.67 06/06/2014 1113       Lab Results  Component Value Date   WBC 6.5 06/06/2014   HGB 13.8 06/06/2014   HCT 43.1 06/06/2014   MCV 91.6 06/06/2014   PLT 171 06/06/2014   NEUTROABS 3.8 06/06/2014    ASSESSMENT & PLAN:  Breast cancer of upper-outer quadrant of left female breast ([place]) Left breast cancer treated with mastectomy and sentinel lymph node well-differentiated invasive ductal carcinoma 4.3 cm T2 N0 M0 stage II a current and Arimidex 1 mg daily since December 2012 She will complete 5 years of antiestrogen therapy with December 2017. I instructed her that she can stop the medication at the end of December.  Arimidex  toxicities: No major side effects Arimidex.  Breast cancer surveillance: 1. Mammograms to be done October 2016 2. Breast exam 12/31/15 is normal  Return to clinic on an as-needed basis.  No orders of the defined types were placed in this encounter.  The patient has a good understanding of the overall plan. she agrees with it. she will call with any problems that may develop before the next visit here.   Rulon Eisenmenger, MD 12/31/15

## 2017-12-08 ENCOUNTER — Other Ambulatory Visit: Payer: Medicare Other | Admitting: Primary Care

## 2017-12-08 DIAGNOSIS — Z515 Encounter for palliative care: Secondary | ICD-10-CM

## 2017-12-08 DIAGNOSIS — I4891 Unspecified atrial fibrillation: Secondary | ICD-10-CM

## 2017-12-08 DIAGNOSIS — E1100 Type 2 diabetes mellitus with hyperosmolarity without nonketotic hyperglycemic-hyperosmolar coma (NKHHC): Secondary | ICD-10-CM

## 2017-12-08 NOTE — Progress Notes (Signed)
PALLIATIVE CARE CONSULT VISIT   PATIENT NAME: Mary Doyle DOB: 06-08-1931 MRN: [id number]  PRIMARY CARE PROVIDER:   Prince Solian, MD  REFERRING PROVIDER:  Prince Solian, MD [place], [place] [address]  RESPONSIBLE PARTY:   Self, daughter Benn Moulder Daughter [phone]  [phone]     ASSESSMENT and RECOMMENDATIONS :   1. Goals of Care: Has MOST with DNAR but limited scope of treatment. On refrigerator with spouse's form. Reviewed today and clarified patient would not want compressions but would want to go to the hospital to see if they could make her better. She would not want to be on a ventilator. We discussed tube feedings since her husband has one. She understands the ramifications of beginning this therapy with possibility of good quality of life vs very slight chance. She would not want to prolong life without quality. Main concerns are for care of her husband whom we are also following. Some resources for services discussed.  Palliative to discharge at this time as patient has no further concerns. Will be happy to resume services when desired.   I spent 15 minutes providing this consultation,  from  13:00 to 13:15 . More than 50% of the time in this consultation was spent coordinating communication.   HISTORY OF PRESENT ILLNESS:  MARICELA SCHREUR is a 84 y.o. year old female with multiple medical problems including HTN, HLD, DM, h/o breast CA . Palliative Care was asked to help address goals of care.   CODE STATUS: DNAR, MOST with limited scope of treatment. PPS: 60% HOSPICE ELIGIBILITY/DIAGNOSIS: TBD  PAST MEDICAL HISTORY:  Past Medical History:  Diagnosis Date  . Adrenal mass ([place])   . Atrial fibrillation, new onset ([place])   . Breast cancer ([place])   . Diabetes mellitus   . Glaucoma   . Hyperlipidemia   . Hyperparathyroidism   . Hypertension   . Lump in female breast   . 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 Renal artery stenosis ([facility])       SOCIAL HX:  Social History   Tobacco Use  . Smoking status: Never Smoker  . Smokeless tobacco: Never Used  Substance Use Topics  . Alcohol use: No    ALLERGIES: No Known Allergies   PERTINENT MEDICATIONS:  Outpatient Encounter Medications as of 12/08/2017  Medication Sig  . alendronate (FOSAMAX) 70 MG tablet Take 70 mg by mouth once a week. Takes on Sundays  . amLODipine (NORVASC) 5 MG tablet Take 5 mg by mouth daily.   Marland Kitchen anastrozole (ARIMIDEX) 1 MG tablet TAKE 1 TABLET BY MOUTH ONCE A DAY  . apixaban (ELIQUIS) 5 MG TABS tablet Take 1 tablet (5 mg total) by mouth 2 (two) times daily.  Marland Kitchen atorvastatin (LIPITOR) 40 MG tablet   . CINNAMON PO Take 1 capsule by mouth 2 (two) times daily.  . ergocalciferol (VITAMIN D2) 50000 UNITS capsule Take 50,000 Units by mouth once a week. Takes on Sundays  . fluorometholone (FML) 0.1 % ophthalmic suspension Place 1 drop into both eyes 2 (two) times daily as needed (irritation).   . fluticasone (FLONASE) 50 MCG/ACT nasal spray Place 1-2 sprays into both nostrils daily.   . furosemide (LASIX) 40 MG tablet Take 1 tablet (40 mg total) by mouth daily.  Marland Kitchen glipiZIDE (GLUCOTROL XL) 5 MG 24 hr tablet Take 5 mg by mouth 2 (two) times daily with a meal.  . latanoprost (XALATAN) 0.005 % ophthalmic solution Place 1 drop into both eyes at  bedtime.   Marland Kitchen loratadine (CLARITIN) 10 MG tablet Take 10 mg by mouth daily.  Marland Kitchen LORazepam (ATIVAN) 0.5 MG tablet Take 0.5 mg by mouth daily as needed for anxiety.   Marland Kitchen losartan (COZAAR) 100 MG tablet Take 50 mg by mouth 2 (two) times daily.  . metFORMIN (GLUCOPHAGE) 1000 MG tablet Take 1,000 mg by mouth 2 (two) times daily with a meal.   . metoprolol (TOPROL-XL) 200 MG 24 hr tablet Take 200 mg by mouth daily.   . Multiple Vitamins-Minerals (CENTRUM SILVER ADULT 50+ PO) Take 1 tablet by mouth daily.   . Omega-3 Fatty Acids (FISH OIL) 1000 MG CAPS Take 1,000 mg by mouth 2 (two) times daily.  . ONE TOUCH ULTRA TEST test strip   .  polyethylene glycol (MIRALAX / GLYCOLAX) packet Take 17 g by mouth daily.  . potassium chloride SA (K-DUR,KLOR-CON) 20 MEQ tablet Take 1 tablet (20 mEq total) by mouth 2 (two) times daily.  . promethazine (PHENERGAN) 25 MG tablet 25 mg every 6 (six) hours as needed for nausea or vomiting.   . ranitidine (ZANTAC) 150 MG tablet Take 150 mg by mouth 2 (two) times daily.   . traMADol (ULTRAM) 50 MG tablet Take 50 mg by mouth 2 (two) times daily as needed (pain).   No facility-administered encounter medications on file as of 12/08/2017.     PHYSICAL EXAM:  VS afeb, 78-18 145/91 99% ox sat. General: NAD, frail appearing, obese Cardiovascular: regular rate and rhythm, denies palpitations, chest pain Pulmonary:denies cough Abdomen: soft, nontender, denies constipation Extremities: no edema, no joint deformities Skin: no rashes Neurological: Weakness but otherwise nonfocal. Denies falls.  Estevan Oaks, NP

## 2018-03-18 ENCOUNTER — Other Ambulatory Visit: Payer: Self-pay

## 2018-03-18 ENCOUNTER — Inpatient Hospital Stay (HOSPITAL_COMMUNITY)
Admission: EM | Admit: 2018-03-18 | Discharge: 2018-03-21 | DRG: 208 | Disposition: A | Discharge status: Hospice | Payer: Medicare Other | Attending: Internal Medicine | Admitting: Internal Medicine

## 2018-03-18 ENCOUNTER — Emergency Department (HOSPITAL_COMMUNITY): Payer: Medicare Other

## 2018-03-18 DIAGNOSIS — R0602 Shortness of breath: Secondary | ICD-10-CM

## 2018-03-18 DIAGNOSIS — J9601 Acute respiratory failure with hypoxia: Secondary | ICD-10-CM | POA: Diagnosis present

## 2018-03-18 DIAGNOSIS — I722 Aneurysm of renal artery: Secondary | ICD-10-CM | POA: Diagnosis present

## 2018-03-18 DIAGNOSIS — I701 Atherosclerosis of renal artery: Secondary | ICD-10-CM | POA: Diagnosis present

## 2018-03-18 DIAGNOSIS — Z833 Family history of diabetes mellitus: Secondary | ICD-10-CM

## 2018-03-18 DIAGNOSIS — R609 Edema, unspecified: Secondary | ICD-10-CM | POA: Diagnosis present

## 2018-03-18 DIAGNOSIS — J181 Lobar pneumonia, unspecified organism: Secondary | ICD-10-CM | POA: Diagnosis not present

## 2018-03-18 DIAGNOSIS — E1122 Type 2 diabetes mellitus with diabetic chronic kidney disease: Secondary | ICD-10-CM | POA: Diagnosis present

## 2018-03-18 DIAGNOSIS — R0902 Hypoxemia: Secondary | ICD-10-CM

## 2018-03-18 DIAGNOSIS — E875 Hyperkalemia: Secondary | ICD-10-CM | POA: Diagnosis present

## 2018-03-18 DIAGNOSIS — I5033 Acute on chronic diastolic (congestive) heart failure: Secondary | ICD-10-CM | POA: Diagnosis present

## 2018-03-18 DIAGNOSIS — Z515 Encounter for palliative care: Secondary | ICD-10-CM | POA: Diagnosis not present

## 2018-03-18 DIAGNOSIS — R739 Hyperglycemia, unspecified: Secondary | ICD-10-CM

## 2018-03-18 DIAGNOSIS — I340 Nonrheumatic mitral (valve) insufficiency: Secondary | ICD-10-CM | POA: Diagnosis not present

## 2018-03-18 DIAGNOSIS — Z79891 Long term (current) use of opiate analgesic: Secondary | ICD-10-CM

## 2018-03-18 DIAGNOSIS — I313 Pericardial effusion (noninflammatory): Secondary | ICD-10-CM | POA: Diagnosis present

## 2018-03-18 DIAGNOSIS — L8990 Pressure ulcer of unspecified site, unspecified stage: Secondary | ICD-10-CM

## 2018-03-18 DIAGNOSIS — I482 Chronic atrial fibrillation, unspecified: Secondary | ICD-10-CM | POA: Diagnosis present

## 2018-03-18 DIAGNOSIS — L89312 Pressure ulcer of right buttock, stage 2: Secondary | ICD-10-CM | POA: Diagnosis present

## 2018-03-18 DIAGNOSIS — I130 Hypertensive heart and chronic kidney disease with heart failure and stage 1 through stage 4 chronic kidney disease, or unspecified chronic kidney disease: Secondary | ICD-10-CM | POA: Diagnosis present

## 2018-03-18 DIAGNOSIS — J189 Pneumonia, unspecified organism: Secondary | ICD-10-CM | POA: Diagnosis present

## 2018-03-18 DIAGNOSIS — Z7951 Long term (current) use of inhaled steroids: Secondary | ICD-10-CM

## 2018-03-18 DIAGNOSIS — E785 Hyperlipidemia, unspecified: Secondary | ICD-10-CM | POA: Diagnosis present

## 2018-03-18 DIAGNOSIS — Z66 Do not resuscitate: Secondary | ICD-10-CM | POA: Diagnosis present

## 2018-03-18 DIAGNOSIS — Z7189 Other specified counseling: Secondary | ICD-10-CM | POA: Diagnosis not present

## 2018-03-18 DIAGNOSIS — Z7984 Long term (current) use of oral hypoglycemic drugs: Secondary | ICD-10-CM

## 2018-03-18 DIAGNOSIS — R32 Unspecified urinary incontinence: Secondary | ICD-10-CM | POA: Diagnosis present

## 2018-03-18 DIAGNOSIS — Z170 Estrogen receptor positive status [ER+]: Secondary | ICD-10-CM

## 2018-03-18 DIAGNOSIS — I10 Essential (primary) hypertension: Secondary | ICD-10-CM | POA: Diagnosis present

## 2018-03-18 DIAGNOSIS — E1165 Type 2 diabetes mellitus with hyperglycemia: Secondary | ICD-10-CM | POA: Diagnosis present

## 2018-03-18 DIAGNOSIS — L89322 Pressure ulcer of left buttock, stage 2: Secondary | ICD-10-CM | POA: Diagnosis present

## 2018-03-18 DIAGNOSIS — Z853 Personal history of malignant neoplasm of breast: Secondary | ICD-10-CM

## 2018-03-18 DIAGNOSIS — R531 Weakness: Secondary | ICD-10-CM | POA: Diagnosis present

## 2018-03-18 DIAGNOSIS — R451 Restlessness and agitation: Secondary | ICD-10-CM | POA: Diagnosis present

## 2018-03-18 DIAGNOSIS — Z8041 Family history of malignant neoplasm of ovary: Secondary | ICD-10-CM

## 2018-03-18 DIAGNOSIS — N183 Chronic kidney disease, stage 3 (moderate): Secondary | ICD-10-CM | POA: Diagnosis present

## 2018-03-18 DIAGNOSIS — I4891 Unspecified atrial fibrillation: Secondary | ICD-10-CM | POA: Diagnosis present

## 2018-03-18 DIAGNOSIS — I361 Nonrheumatic tricuspid (valve) insufficiency: Secondary | ICD-10-CM | POA: Diagnosis not present

## 2018-03-18 DIAGNOSIS — N179 Acute kidney failure, unspecified: Secondary | ICD-10-CM | POA: Diagnosis present

## 2018-03-18 DIAGNOSIS — J9602 Acute respiratory failure with hypercapnia: Secondary | ICD-10-CM | POA: Diagnosis present

## 2018-03-18 DIAGNOSIS — E119 Type 2 diabetes mellitus without complications: Secondary | ICD-10-CM

## 2018-03-18 DIAGNOSIS — H409 Unspecified glaucoma: Secondary | ICD-10-CM | POA: Diagnosis present

## 2018-03-18 DIAGNOSIS — G9341 Metabolic encephalopathy: Secondary | ICD-10-CM

## 2018-03-18 DIAGNOSIS — Z7901 Long term (current) use of anticoagulants: Secondary | ICD-10-CM

## 2018-03-18 DIAGNOSIS — Z801 Family history of malignant neoplasm of trachea, bronchus and lung: Secondary | ICD-10-CM

## 2018-03-18 DIAGNOSIS — Z79899 Other long term (current) drug therapy: Secondary | ICD-10-CM

## 2018-03-18 DIAGNOSIS — I3139 Other pericardial effusion (noninflammatory): Secondary | ICD-10-CM

## 2018-03-18 DIAGNOSIS — C50412 Malignant neoplasm of upper-outer quadrant of left female breast: Secondary | ICD-10-CM | POA: Diagnosis present

## 2018-03-18 LAB — CBC WITH DIFFERENTIAL/PLATELET
Abs Immature Granulocytes: 0.07 10*3/uL (ref 0.00–0.07)
Basophils Absolute: 0 10*3/uL (ref 0.0–0.1)
Basophils Relative: 0 %
EOS ABS: 0.1 10*3/uL (ref 0.0–0.5)
Eosinophils Relative: 1 %
HCT: 42.2 % (ref 36.0–46.0)
Hemoglobin: 12.5 g/dL (ref 12.0–15.0)
Immature Granulocytes: 1 %
Lymphocytes Relative: 16 %
Lymphs Abs: 1.4 10*3/uL (ref 0.7–4.0)
MCH: 28.3 pg (ref 26.0–34.0)
MCHC: 29.6 g/dL — ABNORMAL LOW (ref 30.0–36.0)
MCV: 95.7 fL (ref 80.0–100.0)
Monocytes Absolute: 0.9 10*3/uL (ref 0.1–1.0)
Monocytes Relative: 10 %
Neutro Abs: 6.8 10*3/uL (ref 1.7–7.7)
Neutrophils Relative %: 72 %
Platelets: 264 10*3/uL (ref 150–400)
RBC: 4.41 MIL/uL (ref 3.87–5.11)
RDW: 13.2 % (ref 11.5–15.5)
WBC: 9.2 10*3/uL (ref 4.0–10.5)
nRBC: 0 % (ref 0.0–0.2)

## 2018-03-18 LAB — BASIC METABOLIC PANEL
Anion gap: 12 (ref 5–15)
Anion gap: 9 (ref 5–15)
BUN: 44 mg/dL — AB (ref 8–23)
BUN: 43 mg/dL — ABNORMAL HIGH (ref 8–23)
CO2: 29 mmol/L (ref 22–32)
CO2: 30 mmol/L (ref 22–32)
Calcium: 10.8 mg/dL — ABNORMAL HIGH (ref 8.9–10.3)
Calcium: 10.2 mg/dL (ref 8.9–10.3)
Chloride: 93 mmol/L — ABNORMAL LOW (ref 98–111)
Chloride: 97 mmol/L — ABNORMAL LOW (ref 98–111)
Creatinine, Ser: 1.51 mg/dL — ABNORMAL HIGH (ref 0.44–1.00)
Creatinine, Ser: 1.49 mg/dL — ABNORMAL HIGH (ref 0.44–1.00)
GFR calc Af Amer: 36 mL/min — ABNORMAL LOW (ref >60)
GFR calc Af Amer: 37 mL/min — ABNORMAL LOW (ref >60)
GFR calc non Af Amer: 31 mL/min — ABNORMAL LOW (ref >60)
GFR calc non Af Amer: 32 mL/min — ABNORMAL LOW (ref >60)
Glucose, Bld: 260 mg/dL — ABNORMAL HIGH (ref 70–99)
Glucose, Bld: 209 mg/dL — ABNORMAL HIGH (ref 70–99)
Potassium: 5.1 mmol/L (ref 3.5–5.1)
Potassium: 4.7 mmol/L (ref 3.5–5.1)
Sodium: 134 mmol/L — ABNORMAL LOW (ref 135–145)
Sodium: 136 mmol/L (ref 135–145)

## 2018-03-18 LAB — HEMOGLOBIN A1C
Hgb A1c MFr Bld: 10.1 % — ABNORMAL HIGH (ref 4.8–5.6)
Mean Plasma Glucose: 243.17 mg/dL

## 2018-03-18 LAB — POCT I-STAT EG7
Acid-Base Excess: 2 mmol/L (ref 0.0–2.0)
Bicarbonate: 31 mmol/L — ABNORMAL HIGH (ref 20.0–28.0)
Calcium, Ion: 1.33 mmol/L (ref 1.15–1.40)
HCT: 40 % (ref 36.0–46.0)
Hemoglobin: 13.6 g/dL (ref 12.0–15.0)
O2 Saturation: 56 %
PCO2 VEN: 67.4 mmHg — AB (ref 44.0–60.0)
Potassium: 5 mmol/L (ref 3.5–5.1)
Sodium: 132 mmol/L — ABNORMAL LOW (ref 135–145)
TCO2: 33 mmol/L — ABNORMAL HIGH (ref 22–32)
pH, Ven: 7.271 (ref 7.250–7.430)
pO2, Ven: 34 mmHg (ref 32.0–45.0)

## 2018-03-18 LAB — GLUCOSE, CAPILLARY
Glucose-Capillary: 204 mg/dL — ABNORMAL HIGH (ref 70–99)
Glucose-Capillary: 212 mg/dL — ABNORMAL HIGH (ref 70–99)

## 2018-03-18 LAB — MAGNESIUM: Magnesium: 2 mg/dL (ref 1.7–2.4)

## 2018-03-18 LAB — BRAIN NATRIURETIC PEPTIDE: B Natriuretic Peptide: 573.5 pg/mL — ABNORMAL HIGH (ref 0.0–100.0)

## 2018-03-18 LAB — CBG MONITORING, ED: Glucose-Capillary: 231 mg/dL — ABNORMAL HIGH (ref 70–99)

## 2018-03-18 LAB — PROCALCITONIN: Procalcitonin: <0.1 ng/mL

## 2018-03-18 LAB — TROPONIN I: Troponin I: <0.03 ng/mL (ref <0.03)

## 2018-03-18 MED ORDER — IPRATROPIUM BROMIDE 0.02 % IN SOLN
0.5000 mg | Freq: Once | RESPIRATORY_TRACT | Status: AC
  Administered 2018-03-18: 0.5 mg via RESPIRATORY_TRACT
  Filled 2018-03-18: qty 2.5

## 2018-03-18 MED ORDER — APIXABAN 2.5 MG PO TABS
2.5000 mg | ORAL_TABLET | Freq: Two times a day (BID) | ORAL | Status: DC
  Administered 2018-03-19: 2.5 mg via ORAL
  Filled 2018-03-18 (×2): qty 1

## 2018-03-18 MED ORDER — FLUTICASONE PROPIONATE 50 MCG/ACT NA SUSP
1.0000 | Freq: Every day | NASAL | Status: DC
  Administered 2018-03-19: 1 via NASAL
  Filled 2018-03-18: qty 16

## 2018-03-18 MED ORDER — INSULIN ASPART 100 UNIT/ML ~~LOC~~ SOLN
0.0000 [IU] | Freq: Every day | SUBCUTANEOUS | Status: DC
  Administered 2018-03-18 – 2018-03-19 (×2): 2 [IU] via SUBCUTANEOUS

## 2018-03-18 MED ORDER — SODIUM CHLORIDE 0.9 % IV SOLN
1.0000 g | INTRAVENOUS | Status: DC
  Filled 2018-03-18: qty 10

## 2018-03-18 MED ORDER — TRAMADOL HCL 50 MG PO TABS
50.0000 mg | ORAL_TABLET | Freq: Two times a day (BID) | ORAL | Status: DC | PRN
  Filled 2018-03-18: qty 1

## 2018-03-18 MED ORDER — LATANOPROST 0.005 % OP SOLN
1.0000 [drp] | Freq: Every day | OPHTHALMIC | Status: DC
  Administered 2018-03-18 – 2018-03-21 (×3): 1 [drp] via OPHTHALMIC
  Filled 2018-03-18: qty 2.5

## 2018-03-18 MED ORDER — SODIUM CHLORIDE 0.9 % IV SOLN
1.0000 g | Freq: Once | INTRAVENOUS | Status: AC
  Administered 2018-03-18: 1 g via INTRAVENOUS
  Filled 2018-03-18: qty 10

## 2018-03-18 MED ORDER — LORATADINE 10 MG PO TABS
10.0000 mg | ORAL_TABLET | Freq: Every day | ORAL | Status: DC
  Administered 2018-03-18 – 2018-03-19 (×2): 10 mg via ORAL
  Filled 2018-03-18 (×2): qty 1

## 2018-03-18 MED ORDER — SODIUM CHLORIDE 0.9 % IV SOLN
500.0000 mg | Freq: Once | INTRAVENOUS | Status: AC
  Administered 2018-03-18: 500 mg via INTRAVENOUS
  Filled 2018-03-18: qty 500

## 2018-03-18 MED ORDER — POLYETHYLENE GLYCOL 3350 17 G PO PACK
17.0000 g | PACK | Freq: Every day | ORAL | Status: DC
  Administered 2018-03-19: 17 g via ORAL
  Filled 2018-03-18: qty 1

## 2018-03-18 MED ORDER — AMLODIPINE BESYLATE 5 MG PO TABS
5.0000 mg | ORAL_TABLET | Freq: Every day | ORAL | Status: DC
  Administered 2018-03-18 – 2018-03-19 (×2): 5 mg via ORAL
  Filled 2018-03-18 (×2): qty 1

## 2018-03-18 MED ORDER — METOPROLOL SUCCINATE ER 100 MG PO TB24
200.0000 mg | ORAL_TABLET | Freq: Every day | ORAL | Status: DC
  Administered 2018-03-18 – 2018-03-19 (×2): 200 mg via ORAL
  Filled 2018-03-18 (×2): qty 2

## 2018-03-18 MED ORDER — INSULIN ASPART 100 UNIT/ML ~~LOC~~ SOLN
0.0000 [IU] | Freq: Three times a day (TID) | SUBCUTANEOUS | Status: DC
  Administered 2018-03-19: 3 [IU] via SUBCUTANEOUS
  Administered 2018-03-19 (×2): 2 [IU] via SUBCUTANEOUS

## 2018-03-18 MED ORDER — SODIUM CHLORIDE 0.9 % IV SOLN
500.0000 mg | INTRAVENOUS | Status: DC
  Filled 2018-03-18: qty 500

## 2018-03-18 MED ORDER — LORAZEPAM 0.5 MG PO TABS
0.5000 mg | ORAL_TABLET | Freq: Every day | ORAL | Status: DC | PRN
  Administered 2018-03-19: 0.5 mg via ORAL
  Filled 2018-03-18 (×2): qty 1

## 2018-03-18 MED ORDER — ATORVASTATIN CALCIUM 40 MG PO TABS
40.0000 mg | ORAL_TABLET | Freq: Every day | ORAL | Status: DC
  Administered 2018-03-18 – 2018-03-19 (×2): 40 mg via ORAL
  Filled 2018-03-18 (×2): qty 1

## 2018-03-18 MED ORDER — FAMOTIDINE 20 MG PO TABS
20.0000 mg | ORAL_TABLET | Freq: Every day | ORAL | Status: DC
  Administered 2018-03-18 – 2018-03-19 (×2): 20 mg via ORAL
  Filled 2018-03-18 (×2): qty 1

## 2018-03-18 MED ORDER — SODIUM CHLORIDE 0.9 % IV BOLUS
1000.0000 mL | Freq: Once | INTRAVENOUS | Status: AC
  Administered 2018-03-18: 1000 mL via INTRAVENOUS

## 2018-03-18 MED ORDER — ALBUTEROL SULFATE (2.5 MG/3ML) 0.083% IN NEBU
5.0000 mg | INHALATION_SOLUTION | Freq: Once | RESPIRATORY_TRACT | Status: AC
  Administered 2018-03-18: 5 mg via RESPIRATORY_TRACT
  Filled 2018-03-18: qty 6

## 2018-03-18 NOTE — ED Provider Notes (Signed)
   Face-to-face evaluation   History: She is here for evaluation of generalized weakness and difficulty walking.  Sugars been running high. Patient checks her own blood sugar.  Several days ago family members found that her meter with showing high sugars, 300 or 400.  Her endocrinologist was contacted and he recommended increasing glipizide to twice a day.  Physical exam: Alert elderly female.  She is a poor historian.  Mouth very dry oral mucous membranes.  Heart regular rate and rhythm without murmur lungs clear anteriorly.  Soft and nontender.  MDM-hyperglycemia with apparent dehydration.  Patient has paroxysmal atrial fibrillation and is anticoagulated.  Screening for metabolic instability initiated, and IV resuscitation begun.  3:50 PM-seeing reports patient hypoxic at 79%.  On room air.  Based on nasal cannula oxygen, at 3 L, oxygen saturation improved to greater than 90%.  Medical screening examination/treatment/procedure(s) were conducted as a shared visit with non-physician practitioner(s) and myself.  I personally evaluated the patient during the encounter     Daleen Bo, MD 03/19/18 [phone]

## 2018-03-18 NOTE — ED Triage Notes (Signed)
[facility] reports that the patient's family said she has been weak the past two weeks and went from using her walker sometimes to all the time. Her blood sugar has been running in the 400s the past week and is normally in the 200s. Her glipizide dosage was changed 2 months ago and her blood sugar has been variable since. She has had nasal congestion and cough with clear phlegm for the past week. EMS reports blood sugar of 310, blood pressure of 164/72, respiratory rate of 20, pulse of 80 and oxygen saturation of 99%.

## 2018-03-18 NOTE — ED Notes (Signed)
Placed pt on 3L [place] due to O2 sats dropping to 79%. Primary Rn notified and MD notified.

## 2018-03-18 NOTE — H&P (Signed)
[facility] History and Physical   Patient: Mary Doyle XHB:[id number]   PCP: Prince Solian, MD DOB: 1931/09/10   DOA: 03/18/2018   DOS: 03/18/2018   DOS: the patient was seen and examined on 03/18/2018   Patient coming from: The patient is coming from home.  Chief Complaint: shortnes of breath   HPI: NADINE RYLE is a 85 y.o. female with Past medical history of left breast cancer, type II DM, HLD, hyperparathyroidism, hypercalcemia, chronic A. fib on anticoagulation, renal artery stenosis. Patient presents with complaints of progressively worsening shortness of breath and cough. Family is at bedside with which the patient is living. For last 2 weeks patient has been having progressively worsening breathing issues.  Primarily on exertion. She has been able to walk without a walker and now is needing walker assistance just to go from bed to the bathroom. Family reports decreased urinary output. Increasing cough with yellowish expectoration. No fever no chills reported. No nausea no vomiting. No diarrhea no choking episodes reported as well. No burning urination reported. Family also reports that the patient's blood sugar has been running high in 400 range prior to last week and they have been adjusting her diet with which her sugars have improved to 200 range. Family is unable to verify whether there is any prior history of abnormal renal function. No dizziness no lightheadedness no focal deficit, no frequent falls reported. Patient denies any chest pain, chest tightness, palpitation. Patient has chronic leg swelling which is noticeably worse per family in last 1 week.  ED Course: Presents with above complaint, received breathing treatment.  Becomes hypoxic saturation in 79% requiring oxygen.  Chest x-ray shows pneumonia as well as possible pericardial effusion and patient is referred for admission.  At her baseline ambulates with walker And is independent for most of  her ADL; manages her medication on her own.  Review of Systems: as mentioned in the history of present illness.  All other systems reviewed and are negative.  Past Medical History:  Diagnosis Date  . Adrenal mass ([place])   . Atrial fibrillation, new onset ([place])   . Breast cancer ([place])   . Diabetes mellitus   . Glaucoma   . Hyperlipidemia   . Hyperparathyroidism   . Hypertension   . Lump in female breast   . Renal artery aneurysm ([place])   . Renal artery stenosis [facility])    Past Surgical History:  Procedure Laterality Date  . BREAST SURGERY     masectomy - left  . CARDIOVERSION N/A 07/12/2013   Procedure: CARDIOVERSION;  Surgeon: Sueanne Margarita, MD;  Location: [facility] ENDOSCOPY;  Service: Cardiovascular;  Laterality: N/A;  . CATARACT EXTRACTION     Social History:  reports that she has never smoked. She has never used smokeless tobacco. She reports that she does not drink alcohol or use drugs.  No Known Allergies  Family History  Problem Relation Age of Onset  . Diabetes Mother   . Lung cancer Sister   . Ovarian cancer Sister    Prior to Admission medications   Medication Sig Start Date End Date Taking? Authorizing Provider  alendronate (FOSAMAX) 70 MG tablet Take 70 mg by mouth once a week. Takes on Sundays 06/19/10  Yes [provider]  amLODipine (NORVASC) 5 MG tablet Take 5 mg by mouth daily.  07/23/10  Yes [provider]  apixaban (ELIQUIS) 5 MG TABS tablet Take 1 tablet (5 mg total) by mouth 2 (two) times daily. 12/15/13  Yes Turner, Eber Hong, MD  atorvastatin (LIPITOR) 40 MG tablet Take 40 mg by mouth daily.  05/16/14  Yes [provider]  CINNAMON PO Take 1 capsule by mouth 2 (two) times daily.   Yes [provider]  ergocalciferol (VITAMIN D2) 50000 UNITS capsule Take 50,000 Units by mouth once a week. Takes on Sundays   Yes [provider]  fluticasone (FLONASE) 50 MCG/ACT nasal spray Place 1-2 sprays into both nostrils daily.   07/23/10  Yes [provider]  furosemide (LASIX) 40 MG tablet Take 1 tablet (40 mg total) by mouth daily. 12/18/13  Yes Turner, Eber Hong, MD  glipiZIDE (GLUCOTROL XL) 5 MG 24 hr tablet Take 5 mg by mouth 2 (two) times daily with a meal.   Yes [provider]  latanoprost (XALATAN) 0.005 % ophthalmic solution Place 1 drop into both eyes at bedtime.  08/04/10  Yes [provider]  loratadine (CLARITIN) 10 MG tablet Take 10 mg by mouth daily.   Yes [provider]  LORazepam (ATIVAN) 0.5 MG tablet Take 0.5 mg by mouth daily as needed for anxiety.  12/08/12  Yes [provider]  losartan (COZAAR) 100 MG tablet Take 50 mg by mouth 2 (two) times daily.   Yes [provider]  metFORMIN (GLUCOPHAGE) 1000 MG tablet Take 1,000 mg by mouth 2 (two) times daily with a meal.  07/23/10  Yes [provider]  metoprolol (TOPROL-XL) 200 MG 24 hr tablet Take 200 mg by mouth daily.  07/23/10  Yes [provider]  Multiple Vitamins-Minerals (CENTRUM SILVER ADULT 50+ PO) Take 1 tablet by mouth daily.    Yes [provider]  polyethylene glycol (MIRALAX / GLYCOLAX) packet Take 17 g by mouth daily.   Yes [provider]  potassium chloride SA (K-DUR,KLOR-CON) 20 MEQ tablet Take 1 tablet (20 mEq total) by mouth 2 (two) times daily. 08/03/14  Yes Sueanne Margarita, MD  promethazine (PHENERGAN) 25 MG tablet Take 25 mg by mouth every 6 (six) hours as needed for nausea or vomiting.  06/19/10  Yes [provider]  ranitidine (ZANTAC) 150 MG tablet Take 150 mg by mouth 2 (two) times daily.  07/23/10  Yes [provider]  traMADol (ULTRAM) 50 MG tablet Take 50 mg by mouth 2 (two) times daily as needed (pain).   Yes [provider]  anastrozole (ARIMIDEX) 1 MG tablet TAKE 1 TABLET BY MOUTH ONCE A DAY Patient not taking: Reported on 03/18/2018 11/15/15   Nicholas Lose, MD  ONE [facility] ULTRA TEST test strip  06/19/10   [provider]    Physical Exam: Vitals:   03/18/18 1315 03/18/18 1545 03/18/18 1615 03/18/18 1645  BP: (!) 140/99 128/79 135/84 134/70  Pulse: (!) 112 79 (!) 32 75  Resp: (!) 28 (!) 23 (!) 21 17  Temp:      TempSrc:      SpO2: 95% (!) 79% (!) 88% 97%  Weight: 72.6 kg     Height: 5' (1.524 m)       General: Alert, Awake and Oriented to Time, Place and Person. Appear in mild distress, affect appropriate Eyes: PERRL, Conjunctiva normal ENT: Oral Mucosa clear moist. Neck: positive JVD, no Abnormal Mass Or lumps Cardiovascular: S1 and S2 Present, no Murmur, Peripheral Pulses Present Respiratory: increased respiratory effort, Bilateral Air entry equal and Decreased, no use of accessory muscle, bilateral basal; Crackles, no wheezes Abdomen: Bowel Sound present, Soft and n tenderness, ono hernia  Skin: no redness, no Rash, no induration Extremities: bilateral  Pedal edema, no calf tenderness Neurologic: Grossly no focal neuro deficit. Bilaterally Equal motor strength  Labs on Admission:  CBC: Recent Labs  Lab 03/18/18 1352 03/18/18 1401  WBC 9.2  --   NEUTROABS 6.8  --   HGB 12.5 13.6  HCT 42.2 40.0  MCV 95.7  --   PLT 264  --    Basic Metabolic Panel: Recent Labs  Lab 03/18/18 1352 03/18/18 1401  NA 134* 132*  K 5.1 5.0  CL 93*  --   CO2 29  --   GLUCOSE 260*  --   BUN 44*  --   CREATININE 1.51*  --   CALCIUM 10.8*  --    GFR: Estimated Creatinine Clearance: 24.2 mL/min (A) (by C-G formula based on SCr of 1.51 mg/dL (H)). Liver Function Tests: No results for input(s): AST, ALT, ALKPHOS, BILITOT, PROT, ALBUMIN in the last 168 hours. No results for input(s): LIPASE, AMYLASE in the last 168 hours. No results for input(s): AMMONIA in the last 168 hours. Coagulation Profile: No results for input(s): INR, PROTIME in the last 168 hours. Cardiac Enzymes: Recent Labs  Lab 03/18/18 1352  TROPONINI <0.03   BNP (last 3 results) No results for input(s): PROBNP in the  last 8760 hours. HbA1C: No results for input(s): HGBA1C in the last 72 hours. CBG: Recent Labs  Lab 03/18/18 1249  GLUCAP 231*   Lipid Profile: No results for input(s): CHOL, HDL, LDLCALC, TRIG, CHOLHDL, LDLDIRECT in the last 72 hours. Thyroid Function Tests: No results for input(s): TSH, T4TOTAL, FREET4, T3FREE, THYROIDAB in the last 72 hours. Anemia Panel: No results for input(s): VITAMINB12, FOLATE, FERRITIN, TIBC, IRON, RETICCTPCT in the last 72 hours. Urine analysis: No results found for: COLORURINE, APPEARANCEUR, LABSPEC, PHURINE, GLUCOSEU, HGBUR, BILIRUBINUR, KETONESUR, PROTEINUR, UROBILINOGEN, NITRITE, LEUKOCYTESUR  Radiological Exams on Admission: Dg Chest 2 View  Result Date: 03/18/2018 CLINICAL DATA:  Cough and generalized weakness. Evaluate for pneumonia. EXAM: CHEST - 2 VIEW COMPARISON:  09/04/2010 FINDINGS: Interval increase in the size of the cardiopericardial silhouette raising suspicion for possible pericardial effusion or dilated cardiomyopathy. Obscuration of the left hemidiaphragm and left heart border from presumed left effusion and atelectasis. Superimposed pneumonia would be difficult to entirely exclude at the left lung base. Mild interstitial edema is seen. Pleural thickening at the right lung apex is stable. Moderate aortic atherosclerosis is identified at the arch without definite aneurysm. Osteoarthritis of the included acromioclavicular and glenohumeral joints. Small right pleural effusion is noted posteriorly. Osteopenia and thoracic spondylosis is noted of the dorsal spine. IMPRESSION: 1. Interval increase in the size of the cardiopericardial silhouette raising suspicion for possible pericardial effusion. Dilated cardiomyopathy is also possibility. 2. Obscuration of the left hemidiaphragm and left heart border from left effusion and atelectasis. Superimposed pneumonia would be difficult to entirely exclude at the left lung base. Small posterior right pleural  effusion. 3. Mild interstitial edema. Electronically Signed   By: Ashley Royalty M.D.   On: 03/18/2018 15:43   EKG: Independently reviewed. normal sinus rhythm, nonspecific ST and T waves changes.  Assessment/Plan 1. CAP (community acquired pneumonia) Presents with cough and shortness of breath. Chest x-ray Met about possible community-acquired pneumonia. No leukocytosis. We will check procalcitonin level. Blood cultures will be ordered. Follow-up on work-up. We will continue with IV ceftriaxone and azithromycin. Incentive spirometry and Mucinex. Continue to monitor on telemetry.  2.  Dyspnea on exertion. Chronic diastolic CHF. Chronic A. fib.  Chronic bilateral pedal edema. Patient presents with progressively worsening dyspnea on exertion with decrease urinary output. Family reports that the patient has been compliant with all her medications. Received IV normal saline bolus here in the ER with the concern for pneumonia. Renal function mildly worsening but family is unaware of patient's baseline and last creatinine check here was 4 years ago. I suspect there is an acute component of her diastolic dysfunction. Currently only reliable indicator would be patient's weight.  Family reports that her baseline weight is 176 to 178 pounds. We will check a proper scale weight and treat accordingly. Check BNP. Check echocardiogram tomorrow morning. Normal blood pressure will avoid further aggressive IV hydration. Continue Eliquis for chronic A. fib.  On metoprolol which we will continue as well.  3.  Suspected acute kidney injury. Patient presents with complaints of decreased urinary output. Serum creatinine 1.5. Baseline around 1.0 4 years ago. Family is unaware of recent baseline. BUN also elevated which is consistent with prerenal etiology. Patient received IV fluid bolus. We will monitor. Strict weight and daily in and out.  4.  Mild hyperkalemia. Chronic hypercalcemia. Received IV  hydration. We will monitor. Currently does not appear to be requiring acute treatment.  5.  History of left breast cancer. ER positive Treated with mastectomy. Started on Arimidex which she completed therapy in December 2017. No concerns reported by patient.  6.  Hyperlipidemia. Continue statin.  7.  Type 2 diabetes mellitus, uncontrolled with hyperglycemia. With renal complication. Check hemoglobin A1c. Continue with sliding scale insulin for now. Carb modified diet. Home regimen include metformin. In November her glipizide was reduced from 1 twice daily to 1 daily. May require dose adjustment as the family is reporting hyperglycemia with sugars in 400 at home.  Nutrition: Cardiac and carb modified diet, aspiration precaution DVT Prophylaxis: on therapeutic anticoagulation.  Advance goals of care discussion: DNR DNI   Consults: none  Family Communication: family was present at bedside, at the time of interview.  Opportunity was given to ask question and all questions were answered satisfactorily.  Disposition: Admitted as inpatient, telemetry unit. Likely to be discharged home, in 3-4 days.  Severity of Illness: The appropriate patient status for this patient is INPATIENT. Inpatient status is judged to be reasonable and necessary in order to provide the required intensity of service to ensure the patient's safety. The patient's presenting symptoms, physical exam findings, and initial radiographic and laboratory data in the context of their chronic comorbidities is felt to place them at high risk for further clinical deterioration. Furthermore, it is not anticipated that the patient will be medically stable for discharge from the hospital within 2 midnights of admission. The following factors support the patient status of inpatient.   " The patient's presenting symptoms include shortness of breath and dyspnea on exertion. " The worrisome physical exam findings include positive  JVD, bilateral lower extreme edema, bilateral basal crackles, hypoxia 79% on room air. " The initial radiographic and laboratory data are worrisome because of pneumonia, possible pericardial effusion. " The chronic co-morbidities include chronic A. fib, chronic CHF, breast cancer, chronic anticoagulation.   * I certify that at the point of admission it is my clinical judgment that the patient will require inpatient hospital care spanning beyond 2 midnights from the point of admission due to high intensity of service, high risk for further deterioration and high frequency of surveillance required.*    Author: Berle Mull, MD [facility] 03/18/2018  To reach On-call, see  care teams to locate the attending and reach out to them via [email or web address]. If 7PM-7AM, please contact night-coverage If you still have difficulty reaching the attending provider, please page the [facility] (Director on Call) for [facility] on amion for assistance.

## 2018-03-18 NOTE — ED Provider Notes (Signed)
[facility]ERG[facility] DEPARTMENT Provider Note   CSN: [id number] Arrival date & time: 03/18/18  1251     History   Chief Complaint Chief Complaint  Patient presents with  . Weakness  . Hyperglycemia    HPI Mary Doyle is a 85 y.o. female with a PMHx of Afib on eliquis, DM2, HLD, HTN, hyperparathyroidism, renal artery aneurysm and stenosis, and other conditions listed below, who presents to the ED accompanied by her daughters, with complaints of generalized weakness that has been gradually worsening over the last 2 weeks, worse today.  Her sugars have also been elevated for the last week in the 300s.  Lastly she mentions that for the last 3 to 4 days she has had a cough with white-yellow sputum production and shortness of breath.  Symptoms worsen with activity, and her cough has been improved with Mucinex and Tessalon.  They mention that yesterday she was still getting up to go to the bathroom and color and do her normal activities, but today she felt weaker and did not feel like doing those activities.  They deny any confusion recently.  They mention that she's been holding her urine and then having functional incontinence because she can't get to the restroom in time; it seems she may be urinating more frequently lately.   Her PCP is Dr. Dagmar Hait.  They deny any fevers, chills, chest pain, abdominal pain, nausea, vomiting, diarrhea, constipation, dysuria, hematuria, myalgias, arthralgias, numbness, tingling, focal weakness, or any other complaints at this time.  The history is provided by the patient, medical records and a relative. No language interpreter was used.  Weakness  Associated symptoms: cough, frequency and shortness of breath   Associated symptoms: no abdominal pain, no arthralgias, no chest pain, no diarrhea, no dysuria, no fever, no myalgias, no nausea and no vomiting   Hyperglycemia  Associated symptoms: fatigue and shortness of breath   Associated  symptoms: no abdominal pain, no chest pain, no confusion, no dysuria, no fever, no nausea, no vomiting and no weakness     Past Medical History:  Diagnosis Date  . Adrenal mass ([place])   . Atrial fibrillation, new onset ([place])   . Breast cancer ([place])   . Diabetes mellitus   . Glaucoma   . Hyperlipidemia   . Hyperparathyroidism   . Hypertension   . Lump in female breast   . Renal artery aneurysm ([place])   . Renal artery stenosis [facility])     Patient Active Problem List   Diagnosis Date Noted  . Edema 08/09/2013  . HLD (hyperlipidemia) 07/11/2013  . DM2 (diabetes mellitus, type 2) ([place]) 07/11/2013  . Chronic anticoagulation 01/02/2013  . Hypertension   . Atrial fibrillation    . History of breast cancer 02/26/2011  . Hypercalcemia 09/23/2010  . Breast cancer of upper-outer quadrant of left female breast ([place]) 09/23/2010    Past Surgical History:  Procedure Laterality Date  . BREAST SURGERY     masectomy - left  . CARDIOVERSION N/A 07/12/2013   Procedure: CARDIOVERSION;  Surgeon: Sueanne Margarita, MD;  Location: [facility] ENDOSCOPY;  Service: Cardiovascular;  Laterality: N/A;  . CATARACT EXTRACTION       OB History   No obstetric history on file.      Home Medications    Prior to Admission medications   Medication Sig Start Date End Date Taking? Authorizing Provider  alendronate (FOSAMAX) 70 MG tablet Take 70 mg by mouth once a week. Takes on Sundays 06/19/10  [provider]  amLODipine (NORVASC) 5 MG tablet Take 5 mg by mouth daily.  07/23/10   [provider]  anastrozole (ARIMIDEX) 1 MG tablet TAKE 1 TABLET BY MOUTH ONCE A DAY 11/15/15   Nicholas Lose, MD  apixaban (ELIQUIS) 5 MG TABS tablet Take 1 tablet (5 mg total) by mouth 2 (two) times daily. 12/15/13   Sueanne Margarita, MD  atorvastatin (LIPITOR) 40 MG tablet  05/16/14   [provider]  CINNAMON PO Take 1 capsule by mouth 2 (two) times daily.    [provider]  ergocalciferol (VITAMIN  D2) 50000 UNITS capsule Take 50,000 Units by mouth once a week. Takes on Sundays    [provider]  fluorometholone (FML) 0.1 % ophthalmic suspension Place 1 drop into both eyes 2 (two) times daily as needed (irritation).  05/15/10   [provider]  fluticasone (FLONASE) 50 MCG/ACT nasal spray Place 1-2 sprays into both nostrils daily.  07/23/10   [provider]  furosemide (LASIX) 40 MG tablet Take 1 tablet (40 mg total) by mouth daily. 12/18/13   Sueanne Margarita, MD  glipiZIDE (GLUCOTROL XL) 5 MG 24 hr tablet Take 5 mg by mouth 2 (two) times daily with a meal.    [provider]  latanoprost (XALATAN) 0.005 % ophthalmic solution Place 1 drop into both eyes at bedtime.  08/04/10   [provider]  loratadine (CLARITIN) 10 MG tablet Take 10 mg by mouth daily.    [provider]  LORazepam (ATIVAN) 0.5 MG tablet Take 0.5 mg by mouth daily as needed for anxiety.  12/08/12   [provider]  losartan (COZAAR) 100 MG tablet Take 50 mg by mouth 2 (two) times daily.    [provider]  metFORMIN (GLUCOPHAGE) 1000 MG tablet Take 1,000 mg by mouth 2 (two) times daily with a meal.  07/23/10   [provider]  metoprolol (TOPROL-XL) 200 MG 24 hr tablet Take 200 mg by mouth daily.  07/23/10   [provider]  Multiple Vitamins-Minerals (CENTRUM SILVER ADULT 50+ PO) Take 1 tablet by mouth daily.     [provider]  Omega-3 Fatty Acids (FISH OIL) 1000 MG CAPS Take 1,000 mg by mouth 2 (two) times daily.    [provider]  ONE TOUCH ULTRA TEST test strip  06/19/10   [provider]  polyethylene glycol (MIRALAX / GLYCOLAX) packet Take 17 g by mouth daily.    [provider]  potassium chloride SA (K-DUR,KLOR-CON) 20 MEQ tablet Take 1 tablet (20 mEq total) by mouth 2 (two) times daily. 08/03/14   Sueanne Margarita, MD  promethazine (PHENERGAN) 25 MG tablet 25 mg every 6 (six) hours as needed for  nausea or vomiting.  06/19/10   [provider]  ranitidine (ZANTAC) 150 MG tablet Take 150 mg by mouth 2 (two) times daily.  07/23/10   [provider]  traMADol (ULTRAM) 50 MG tablet Take 50 mg by mouth 2 (two) times daily as needed (pain).    [provider]    Family History Family History  Problem Relation Age of Onset  . Diabetes Mother   . Lung cancer Sister   . Ovarian cancer Sister     Social History Social History   Tobacco Use  . Smoking status: Never Smoker  . Smokeless tobacco: Never Used  Substance Use Topics  . Alcohol use: No  . Drug use: No     Allergies  Patient has no known allergies.   Review of Systems Review of Systems  Constitutional: Positive for fatigue. Negative for chills and fever.  Respiratory: Positive for cough and shortness of breath.   Cardiovascular: Negative for chest pain.  Gastrointestinal: Negative for abdominal pain, constipation, diarrhea, nausea and vomiting.  Genitourinary: Positive for frequency. Negative for dysuria and hematuria.  Musculoskeletal: Negative for arthralgias and myalgias.  Skin: Negative for color change.  Allergic/Immunologic: Positive for immunocompromised state (DM2).  Neurological: Negative for weakness and numbness.  Hematological: Bruises/bleeds easily (on eliquis).  Psychiatric/Behavioral: Negative for confusion.   All other systems reviewed and are negative for acute change except as noted in the HPI.    Physical Exam Updated Vital Signs BP 132/71   Pulse 95   Temp 97.9 F (36.6 C) (Oral)   Resp (!) 25   Ht 5' (1.524 m)   Wt 72.6 kg   SpO2 95%   BMI 31.25 kg/m   Physical Exam Vitals signs and nursing note reviewed.  Constitutional:      General: She is not in acute distress.    Appearance: Normal appearance. She is well-developed. She is not toxic-appearing.     Comments: Afebrile, nontoxic, NAD  HENT:     Head: Normocephalic and atraumatic.     Mouth/Throat:       Mouth: Mucous membranes are dry.     Comments: Mouth looks dry Eyes:     General:        Right eye: No discharge.        Left eye: No discharge.     Conjunctiva/sclera: Conjunctivae normal.  Neck:     Musculoskeletal: Normal range of motion and neck supple.  Cardiovascular:     Rate and Rhythm: Normal rate. Rhythm irregularly irregular.     Pulses: Normal pulses.     Heart sounds: Normal heart sounds, S1 normal and S2 normal. No murmur. No friction rub. No gallop.      Comments: Irregularly irregular rhythm, nl rate in the 90s, nl s1/s2, no m/r/g, distal pulses intact Pulmonary:     Effort: Pulmonary effort is normal. No respiratory distress.     Breath sounds: Decreased breath sounds present. No wheezing, rhonchi or rales.     Comments: Diminished lung sounds throughout, no w/r/r appreciated, no hypoxia or increased WOB, speaking in full sentences, SpO2 95% on RA  Abdominal:     General: Bowel sounds are normal. There is no distension.     Palpations: Abdomen is soft. Abdomen is not rigid.     Tenderness: There is no abdominal tenderness. There is no right CVA tenderness, left CVA tenderness, guarding or rebound. Negative signs include Murphy's sign and McBurney's sign.  Musculoskeletal: Normal range of motion.     Comments: MAE x4 Strength and sensation grossly intact in all extremities Distal pulses intact  Skin:    General: Skin is warm and dry.     Findings: No rash.  Neurological:     Mental Status: She is alert and oriented to person, place, and time.     GCS: GCS eye subscore is 4. GCS verbal subscore is 5. GCS motor subscore is 6.     Sensory: Sensation is intact. No sensory deficit.     Motor: Motor function is intact.     Comments: No focal neuro deficits, A&O x4  Psychiatric:        Mood and Affect: Mood and affect normal.        Behavior: Behavior  normal.      ED Treatments / Results  Labs (all labs ordered are listed, but only abnormal results are  displayed) Labs Reviewed  CBC WITH DIFFERENTIAL/PLATELET - Abnormal; Notable for the following components:      Result Value   MCHC 29.6 (*)    All other components within normal limits  BASIC METABOLIC PANEL - Abnormal; Notable for the following components:   Sodium 134 (*)    Chloride 93 (*)    Glucose, Bld 260 (*)    BUN 44 (*)    Creatinine, Ser 1.51 (*)    Calcium 10.8 (*)    GFR calc non Af Amer 31 (*)    GFR calc Af Amer 36 (*)    All other components within normal limits  POCT I-STAT EG7 - Abnormal; Notable for the following components:   pCO2, Ven 67.4 (*)    Bicarbonate 31.0 (*)    TCO2 33 (*)    Sodium 132 (*)    All other components within normal limits  CBG MONITORING, ED - Abnormal; Notable for the following components:   Glucose-Capillary 231 (*)    All other components within normal limits  TROPONIN I  URINALYSIS, ROUTINE W REFLEX MICROSCOPIC  BLOOD GAS, VENOUS    EKG EKG Interpretation  Date/Time:  Friday March 18 2018 13:03:47 EST Ventricular Rate:  116 PR Interval:    QRS Duration: 104 QT Interval:  367 QTC Calculation: 442 R Axis:   55 Text Interpretation:  Atrial fibrillation Low voltage, extremity leads Borderline repolarization abnormality Since last tracing Atrial fibrillation is new Confirmed by Daleen Bo [phone]) on 03/18/2018 2:17:35 PM   Radiology Dg Chest 2 View  Result Date: 03/18/2018 CLINICAL DATA:  Cough and generalized weakness. Evaluate for pneumonia. EXAM: CHEST - 2 VIEW COMPARISON:  09/04/2010 FINDINGS: Interval increase in the size of the cardiopericardial silhouette raising suspicion for possible pericardial effusion or dilated cardiomyopathy. Obscuration of the left hemidiaphragm and left heart border from presumed left effusion and atelectasis. Superimposed pneumonia would be difficult to entirely exclude at the left lung base. Mild interstitial edema is seen. Pleural thickening at the right lung apex is stable. Moderate  aortic atherosclerosis is identified at the arch without definite aneurysm. Osteoarthritis of the included acromioclavicular and glenohumeral joints. Small right pleural effusion is noted posteriorly. Osteopenia and thoracic spondylosis is noted of the dorsal spine. IMPRESSION: 1. Interval increase in the size of the cardiopericardial silhouette raising suspicion for possible pericardial effusion. Dilated cardiomyopathy is also possibility. 2. Obscuration of the left hemidiaphragm and left heart border from left effusion and atelectasis. Superimposed pneumonia would be difficult to entirely exclude at the left lung base. Small posterior right pleural effusion. 3. Mild interstitial edema. Electronically Signed   By: Ashley Royalty M.D.   On: 03/18/2018 15:43    Procedures Procedures (including critical care time)  Medications Ordered in ED Medications  sodium chloride 0.9 % bolus 1,000 mL (has no administration in time range)  cefTRIAXone (ROCEPHIN) 1 g in sodium chloride 0.9 % 100 mL IVPB (has no administration in time range)  azithromycin (ZITHROMAX) 500 mg in sodium chloride 0.9 % 250 mL IVPB (has no administration in time range)  albuterol (PROVENTIL) (2.5 MG/3ML) 0.083% nebulizer solution 5 mg (5 mg Nebulization Given 03/18/18 1410)  ipratropium (ATROVENT) nebulizer solution 0.5 mg (0.5 mg Nebulization Given 03/18/18 1410)     Initial Impression / Assessment and Plan / ED Course  I have reviewed the triage vital signs  and the nursing notes.  Pertinent labs & imaging results that were available during my care of the patient were reviewed by me and considered in my medical decision making (see chart for details).     85 y.o. female here with generalized weakness gradually worsening over the last 2 weeks, worse today, with hyperglycemia for the last week, and 3 to 4 days of cough with white-yellow sputum production and shortness of breath.  On exam, lips look dry, mildly diminished lung sounds  throughout, no wheezing, rhonchi, or rales, no hypoxia, no tachycardia although A. fib present.  No abdominal tenderness.  No focal weakness.  Alert and oriented x4.  Will get screening labs, chest x-ray, give nebs and fluids, and reassess shortly. May need admission. Discussed case with my attending Dr. Eulis Foster who agrees with plan.   4:11 PM EKG nonischemic. CBC w/diff WNL. BMP with gluc 260, BUN 44, Cr 1.51, no anion gap or change in bicarb. VBG essentially unremarkable. U/A not yet done. CXR showing interval increase in pericardial silhouette raising suspicion for pericardial effusion vs dilated cardiomyopathy, obscuration of left hemidiaphragm and left heart border from left effusion and atelectasis but superimposed PNA cannot be excluded, mild interstitial edema noted. Lung sounds actually better after nebs, but now with hypoxia; suspect mucus plugging that now opened up and we're seeing the hypoxia. Suspect clinically she has PNA given her symptoms and her CXR. Will treat for this and proceed with admission.   4:30 PM Dr. Posey Pronto of [facility] returning page and will admit. Holding orders to be placed by admitting team. Please see their notes for further documentation of care. I appreciate their help with this pleasant pt's care. Pt stable at time of admission.    Final Clinical Impressions(s) / ED Diagnoses   Final diagnoses:  Generalized weakness  AKI (acute kidney injury) ([place])  Hyperglycemia  Hypoxia  Community acquired pneumonia, unspecified laterality    ED Discharge Orders    [address], [place], [place] 03/18/18 1630    Daleen Bo, MD 03/19/18 [phone]

## 2018-03-18 NOTE — Progress Notes (Signed)
Pt very sleepy on assessment.  Responded with repeated stimulation.  Moved all extremities, smiled, states "leave me alone" and "yall quit."  Pt quickly fell back asleep.  Held PM dose of eliquis due to excessive sleepiness and notified provider Baltazar Najjar) on call of findings.

## 2018-03-19 ENCOUNTER — Inpatient Hospital Stay (HOSPITAL_COMMUNITY): Payer: Medicare Other

## 2018-03-19 DIAGNOSIS — I5033 Acute on chronic diastolic (congestive) heart failure: Secondary | ICD-10-CM

## 2018-03-19 DIAGNOSIS — I340 Nonrheumatic mitral (valve) insufficiency: Secondary | ICD-10-CM

## 2018-03-19 DIAGNOSIS — I361 Nonrheumatic tricuspid (valve) insufficiency: Secondary | ICD-10-CM

## 2018-03-19 LAB — PROCALCITONIN: Procalcitonin: 0.1 ng/mL

## 2018-03-19 LAB — COMPREHENSIVE METABOLIC PANEL
ALT: 16 U/L (ref 0–44)
AST: 12 U/L — AB (ref 15–41)
Albumin: 2.5 g/dL — ABNORMAL LOW (ref 3.5–5.0)
Alkaline Phosphatase: 44 U/L (ref 38–126)
Anion gap: 6 (ref 5–15)
BUN: 47 mg/dL — ABNORMAL HIGH (ref 8–23)
CALCIUM: 9.7 mg/dL (ref 8.9–10.3)
CO2: 31 mmol/L (ref 22–32)
Chloride: 101 mmol/L (ref 98–111)
Creatinine, Ser: 1.62 mg/dL — ABNORMAL HIGH (ref 0.44–1.00)
GFR calc Af Amer: 33 mL/min — ABNORMAL LOW (ref >60)
GFR calc non Af Amer: 29 mL/min — ABNORMAL LOW (ref >60)
Glucose, Bld: 147 mg/dL — ABNORMAL HIGH (ref 70–99)
Potassium: 4.9 mmol/L (ref 3.5–5.1)
Sodium: 138 mmol/L (ref 135–145)
Total Bilirubin: 0.4 mg/dL (ref 0.3–1.2)
Total Protein: 4.9 g/dL — ABNORMAL LOW (ref 6.5–8.1)

## 2018-03-19 LAB — BLOOD GAS, ARTERIAL
ACID-BASE EXCESS: 2.7 mmol/L — AB (ref 0.0–2.0)
Acid-Base Excess: 2.3 mmol/L — ABNORMAL HIGH (ref 0.0–2.0)
BICARBONATE: 30.5 mmol/L — AB (ref 20.0–28.0)
Bicarbonate: 29.3 mmol/L — ABNORMAL HIGH (ref 20.0–28.0)
DRAWN BY: 350431
Delivery systems: POSITIVE
Drawn by: 350431
Expiratory PAP: 5
FIO2: 40
Inspiratory PAP: 14
O2 Content: 7 L/min
O2 Saturation: 97.8 %
O2 Saturation: 96.2 %
PO2 ART: 90.9 mmHg (ref 83.0–108.0)
Patient temperature: 98.6
Patient temperature: 98.7
RATE: 14 resp/min
pCO2 arterial: 85.9 mmHg (ref 32.0–48.0)
pCO2 arterial: 73.6 mmHg (ref 32.0–48.0)
pH, Arterial: 7.176 — CL (ref 7.350–7.450)
pH, Arterial: 7.225 — ABNORMAL LOW (ref 7.350–7.450)
pO2, Arterial: 126 mmHg — ABNORMAL HIGH (ref 83.0–108.0)

## 2018-03-19 LAB — CBC WITH DIFFERENTIAL/PLATELET
Abs Immature Granulocytes: 0.04 10*3/uL (ref 0.00–0.07)
Basophils Absolute: 0 10*3/uL (ref 0.0–0.1)
Basophils Relative: 0 %
EOS ABS: 0.1 10*3/uL (ref 0.0–0.5)
Eosinophils Relative: 2 %
HCT: 34.9 % — ABNORMAL LOW (ref 36.0–46.0)
Hemoglobin: 10.4 g/dL — ABNORMAL LOW (ref 12.0–15.0)
Immature Granulocytes: 1 %
Lymphocytes Relative: 17 %
Lymphs Abs: 1.2 10*3/uL (ref 0.7–4.0)
MCH: 28.6 pg (ref 26.0–34.0)
MCHC: 29.8 g/dL — ABNORMAL LOW (ref 30.0–36.0)
MCV: 95.9 fL (ref 80.0–100.0)
MONOS PCT: 12 %
Monocytes Absolute: 0.9 10*3/uL (ref 0.1–1.0)
Neutro Abs: 4.9 10*3/uL (ref 1.7–7.7)
Neutrophils Relative %: 68 %
Platelets: 200 10*3/uL (ref 150–400)
RBC: 3.64 MIL/uL — ABNORMAL LOW (ref 3.87–5.11)
RDW: 13.2 % (ref 11.5–15.5)
WBC: 7.2 10*3/uL (ref 4.0–10.5)
nRBC: 0 % (ref 0.0–0.2)

## 2018-03-19 LAB — GLUCOSE, CAPILLARY
GLUCOSE-CAPILLARY: 193 mg/dL — AB (ref 70–99)
Glucose-Capillary: 146 mg/dL — ABNORMAL HIGH (ref 70–99)
Glucose-Capillary: 183 mg/dL — ABNORMAL HIGH (ref 70–99)
Glucose-Capillary: 216 mg/dL — ABNORMAL HIGH (ref 70–99)

## 2018-03-19 LAB — ECHOCARDIOGRAM COMPLETE
Height: 60 in
Weight: 2903.02 oz

## 2018-03-19 MED ORDER — FUROSEMIDE 10 MG/ML IJ SOLN
40.0000 mg | Freq: Every day | INTRAMUSCULAR | Status: DC
  Administered 2018-03-19 – 2018-03-20 (×2): 40 mg via INTRAVENOUS
  Filled 2018-03-19 (×2): qty 4

## 2018-03-19 MED ORDER — FUROSEMIDE 10 MG/ML IJ SOLN
40.0000 mg | INTRAMUSCULAR | Status: AC
  Administered 2018-03-19: 40 mg via INTRAVENOUS
  Filled 2018-03-19: qty 4

## 2018-03-19 MED ORDER — LORAZEPAM 2 MG/ML IJ SOLN
0.5000 mg | Freq: Once | INTRAMUSCULAR | Status: AC
  Administered 2018-03-20: 0.5 mg via INTRAVENOUS
  Filled 2018-03-19: qty 1

## 2018-03-19 MED ORDER — FUROSEMIDE 10 MG/ML IJ SOLN
40.0000 mg | Freq: Four times a day (QID) | INTRAMUSCULAR | Status: AC
  Administered 2018-03-20: 40 mg via INTRAVENOUS
  Filled 2018-03-19: qty 4

## 2018-03-19 NOTE — Significant Event (Signed)
Rapid Response Event Note  Overview: Respiratory Distress   Initial Focused Assessment: Called by RT having respiratory distress, per RT, patient appears and sounds very volume overloaded, + wet sounds. I was with another patient in an medical emergency but RT and RN had paged the MD on call. When I arrived, patient was very hard to arouse, woke briefly to painful stimuli and then followed some simple commands but quickly falls asleep. Per family and RN/RT, this is not the baseline for the patient, earlier when the shift started, she was awake and interactive.  Lung sounds: very diminished in the bases  (LT worse than RT), coarse crackles throughout. VSS  Interventions: -- STAT CXR -- Enlargement of the cardiac silhouette, stable since yesterday,  layering effusion with underlying opacity on the left. Possible tiny effusion on the right. -- STAT ABG -- pH 7.17/paCO2 85.9/paO2 126/HCO3 30/5  -- Lasix 40 mg IV x 1 -- BIPAP -- after ABG resulted, we placed the patient on BIPAP 14/5 40%.   Plan of Care: -- Patient's was more responsive when I called for an update at 2245 and 0045. ABG has improved as well. -- Patient to remain on BIPAP overnight as she tolerates.  -- I asked the RN to have patient on continuous pulse oximetry as well since she is on BIPAP  Event Summary:    at    Call Time 1936 Arrival Time 2015  [place], [place]

## 2018-03-19 NOTE — Progress Notes (Signed)
PROGRESS NOTE    Mary Doyle  ZOX:[id number] DOB: September 12, 1931 DOA: 03/18/2018 PCP: Prince Solian, MD     Brief Narrative:  Mary Doyle is a 85 yo female with past medical history significant for left breast cancer, type 2 diabetes, hyperparathyroidism, hypercalcemia, chronic atrial fibrillation on anticoagulation, renal artery stenosis.  She presents with complaints of progressively worsening shortness of breath, cough.  This is been ongoing for the past 2 weeks.  In the emergency department, she required nasal cannula O2, chest x-ray questionable for pneumonia as well as pericardial effusion.    New events last 24 hours / Subjective: Patient complaining of generalized weakness, no respiratory complaints.   Assessment & Plan:   Principal Problem:   Acute on chronic diastolic CHF (congestive heart failure) (HCC) Active Problems:   Hypertension   Atrial fibrillation    Chronic anticoagulation   HLD (hyperlipidemia)   DM2 (diabetes mellitus, type 2) (HCC)   Edema   AKI (acute kidney injury) ([place])   Pressure injury of skin   Acute on chronic diastolic heart failure -Patient's baseline weight is 176 to 178 pounds.  Weight on admission 181 pounds -BNP 573 -Lasix IV started 40mg  daily (takes 40mg  PO daily at home)  -Strict I/Os -Daily weight  -Ted hose ordered   Questionable community-acquired pneumonia  -Procalcitonin <0.10, no leukocytosis -Chest x-ray revealed obscuration of the left hemidiaphragm, superimposed pneumonia would be difficult to exclude -Started on ceftriaxone, azithromycin. Her dyspnea is more likely due to her HF and fluid overload state. Will discontinue antibiotics, watch fever and leukocytosis to determine infectious etiology   Questionable pericardial effusion -Echocardiogram pending  Chronic atrial fibrillation -Continue Eliquis -Continue toprol   HTN -Continue norvasc   HLD -Continue lipitor   AKI vs Chronic kidney disease stage  III-IV -Baseline Creatinine 1 in 2016 -Cr 1.51 --> 1.49 --> 1.62  -Monitor BMP   Type 2 diabetes with hyperglycemia -Hemoglobin A1c 10.1 -Hold metformin, glipizide -Start Lantus, sliding scale insulin   DVT prophylaxis: Eliquis Code Status: DNR Family Communication: No family at bedside, spoke with daughter over the phone  Disposition Plan: Pending further improvement and stabilization of her heart failure, echocardiogram is pending   Consultants:   None  Procedures:   None  Antimicrobials:  Anti-infectives (From admission, onward)   Start     Dose/Rate Route Frequency Ordered Stop   03/19/18 1700  azithromycin (ZITHROMAX) 500 mg in sodium chloride 0.9 % 250 mL IVPB  Status:  Discontinued     500 mg 250 mL/hr over 60 Minutes Intravenous Every 24 hours 03/18/18 1729 03/19/18 1132   03/19/18 1600  cefTRIAXone (ROCEPHIN) 1 g in sodium chloride 0.9 % 100 mL IVPB  Status:  Discontinued     1 g 200 mL/hr over 30 Minutes Intravenous Every 24 hours 03/18/18 1729 03/19/18 1132   03/18/18 1615  cefTRIAXone (ROCEPHIN) 1 g in sodium chloride 0.9 % 100 mL IVPB     1 g 200 mL/hr over 30 Minutes Intravenous  Once 03/18/18 1608 03/18/18 1713   03/18/18 1615  azithromycin (ZITHROMAX) 500 mg in sodium chloride 0.9 % 250 mL IVPB     500 mg 250 mL/hr over 60 Minutes Intravenous  Once 03/18/18 1608 03/18/18 1805       Objective: Vitals:   03/18/18 1645 03/18/18 1820 03/18/18 2258 03/19/18 0735  BP: 134/70 (!) 110/53 111/63 115/74  Pulse: 75 68 (!) 59 79  Resp: 17 15 19 16   Temp:  98.2 F (36.8  C) 98.3 F (36.8 C) 98.3 F (36.8 C)  TempSrc:  Oral Oral Oral  SpO2: 97% 100% 98% 96%  Weight:  82.3 kg    Height:  5' (1.524 m)      Intake/Output Summary (Last 24 hours) at 03/19/2018 1144 Last data filed at 03/19/2018 0446 Gross per 24 hour  Intake 100 ml  Output 400 ml  Net -300 ml   Filed Weights   03/18/18 1315 03/18/18 1820  Weight: 72.6 kg 82.3 kg    Examination:    General exam: Appears calm and comfortable  Respiratory system: Diminished breath sounds bases, no respiratory distress Cardiovascular system: S1 & S2 heard, Irreg rhythm, rate 100s, +pitting edema bilaterally  Gastrointestinal system: Abdomen is nondistended, soft and nontender. No organomegaly or masses felt. Normal bowel sounds heard. Central nervous system: Alert. No focal neurological deficits. Extremities: Symmetric Skin: No rashes, lesions or ulcers on exposed skin  Psychiatry: Judgement and insight appear normal. Mood & affect appropriate.   Data Reviewed: I have personally reviewed following labs and imaging studies  CBC: Recent Labs  Lab 03/18/18 1352 03/18/18 1401 03/19/18 0301  WBC 9.2  --  7.2  NEUTROABS 6.8  --  4.9  HGB 12.5 13.6 10.4*  HCT 42.2 40.0 34.9*  MCV 95.7  --  95.9  PLT 264  --  053   Basic Metabolic Panel: Recent Labs  Lab 03/18/18 1352 03/18/18 1401 03/18/18 2037 03/19/18 0301  NA 134* 132* 136 138  K 5.1 5.0 4.7 4.9  CL 93*  --  97* 101  CO2 29  --  30 31  GLUCOSE 260*  --  209* 147*  BUN 44*  --  43* 47*  CREATININE 1.51*  --  1.49* 1.62*  CALCIUM 10.8*  --  10.2 9.7  MG  --   --  2.0  --    GFR: Estimated Creatinine Clearance: 24.1 mL/min (A) (by C-G formula based on SCr of 1.62 mg/dL (H)). Liver Function Tests: Recent Labs  Lab 03/19/18 0301  AST 12*  ALT 16  ALKPHOS 44  BILITOT 0.4  PROT 4.9*  ALBUMIN 2.5*   No results for input(s): LIPASE, AMYLASE in the last 168 hours. No results for input(s): AMMONIA in the last 168 hours. Coagulation Profile: No results for input(s): INR, PROTIME in the last 168 hours. Cardiac Enzymes: Recent Labs  Lab 03/18/18 1352  TROPONINI <0.03   BNP (last 3 results) No results for input(s): PROBNP in the last 8760 hours. HbA1C: Recent Labs    03/18/18 2031  HGBA1C 10.1*   CBG: Recent Labs  Lab 03/18/18 1249 03/18/18 1802 03/18/18 2106 03/19/18 0734  GLUCAP 231* 204* 212* 146*    Lipid Profile: No results for input(s): CHOL, HDL, LDLCALC, TRIG, CHOLHDL, LDLDIRECT in the last 72 hours. Thyroid Function Tests: No results for input(s): TSH, T4TOTAL, FREET4, T3FREE, THYROIDAB in the last 72 hours. Anemia Panel: No results for input(s): VITAMINB12, FOLATE, FERRITIN, TIBC, IRON, RETICCTPCT in the last 72 hours. Sepsis Labs: Recent Labs  Lab 03/18/18 2037 03/19/18 0301  PROCALCITON <0.10 0.10    Recent Results (from the past 240 hour(s))  Culture, blood (routine x 2) Call MD if unable to obtain prior to antibiotics being given     Status: None (Preliminary result)   Collection Time: 03/18/18  8:31 PM  Result Value Ref Range Status   Specimen Description BLOOD RIGHT ANTECUBITAL  Final   Special Requests   Final    BOTTLES DRAWN  AEROBIC AND ANAEROBIC Blood Culture adequate volume   Culture   Final    NO GROWTH < 12 HOURS Performed at [facility] [address], [place], [place] [address]    Report Status PENDING  Incomplete  Culture, blood (routine x 2) Call MD if unable to obtain prior to antibiotics being given     Status: None (Preliminary result)   Collection Time: 03/18/18  8:43 PM  Result Value Ref Range Status   Specimen Description BLOOD RIGHT HAND  Final   Special Requests   Final    BOTTLES DRAWN AEROBIC ONLY Blood Culture results may not be optimal due to an inadequate volume of blood received in culture bottles   Culture   Final    NO GROWTH < 12 HOURS Performed at [facility], [place] [address] [place], [place] [address]    Report Status PENDING  Incomplete       Radiology Studies: Dg Chest 2 View  Result Date: 03/18/2018 CLINICAL DATA:  Cough and generalized weakness. Evaluate for pneumonia. EXAM: CHEST - 2 VIEW COMPARISON:  09/04/2010 FINDINGS: Interval increase in the size of the cardiopericardial silhouette raising suspicion for possible pericardial effusion or dilated cardiomyopathy. Obscuration of the left hemidiaphragm  and left heart border from presumed left effusion and atelectasis. Superimposed pneumonia would be difficult to entirely exclude at the left lung base. Mild interstitial edema is seen. Pleural thickening at the right lung apex is stable. Moderate aortic atherosclerosis is identified at the arch without definite aneurysm. Osteoarthritis of the included acromioclavicular and glenohumeral joints. Small right pleural effusion is noted posteriorly. Osteopenia and thoracic spondylosis is noted of the dorsal spine. IMPRESSION: 1. Interval increase in the size of the cardiopericardial silhouette raising suspicion for possible pericardial effusion. Dilated cardiomyopathy is also possibility. 2. Obscuration of the left hemidiaphragm and left heart border from left effusion and atelectasis. Superimposed pneumonia would be difficult to entirely exclude at the left lung base. Small posterior right pleural effusion. 3. Mild interstitial edema. Electronically Signed   By: Ashley Royalty M.D.   On: 03/18/2018 15:43      Scheduled Meds: . amLODipine  5 mg Oral Daily  . apixaban  2.5 mg Oral BID  . atorvastatin  40 mg Oral Daily  . famotidine  20 mg Oral Daily  . fluticasone  1-2 spray Each Nare Daily  . furosemide  40 mg Intravenous Daily  . insulin aspart  0-15 Units Subcutaneous TID WC  . insulin aspart  0-5 Units Subcutaneous QHS  . latanoprost  1 drop Both Eyes QHS  . loratadine  10 mg Oral Daily  . metoprolol  200 mg Oral Daily  . polyethylene glycol  17 g Oral Daily   Continuous Infusions:    LOS: 1 day    Time spent: 40 minutes   Dessa Phi, DO [facility] [email or web address] 03/19/2018, 11:44 AM

## 2018-03-19 NOTE — Progress Notes (Signed)
  Echocardiogram 2D Echocardiogram has been performed.  Mary Doyle 03/19/2018, 4:12 PM

## 2018-03-19 NOTE — Progress Notes (Signed)
PT Cancellation Note  Patient Details Name: Mary Doyle MRN: [id number] DOB: 05/11/1931   Cancelled Treatment:    Reason Eval/Treat Not Completed: Other (comment). Pt's family reports that therapy is supposed to be put off for today and they don't feel she is doing well. Will re-attempt at next date.   Shary Decamp [facility] 03/19/2018, 11:38 AM [place] Pager [phone] Office [phone]

## 2018-03-20 DIAGNOSIS — I4891 Unspecified atrial fibrillation: Secondary | ICD-10-CM

## 2018-03-20 DIAGNOSIS — J189 Pneumonia, unspecified organism: Principal | ICD-10-CM

## 2018-03-20 DIAGNOSIS — Z515 Encounter for palliative care: Secondary | ICD-10-CM

## 2018-03-20 DIAGNOSIS — Z7189 Other specified counseling: Secondary | ICD-10-CM

## 2018-03-20 DIAGNOSIS — R0602 Shortness of breath: Secondary | ICD-10-CM

## 2018-03-20 LAB — BASIC METABOLIC PANEL
Anion gap: 9 (ref 5–15)
BUN: 50 mg/dL — ABNORMAL HIGH (ref 8–23)
CALCIUM: 9.9 mg/dL (ref 8.9–10.3)
CO2: 30 mmol/L (ref 22–32)
Chloride: 97 mmol/L — ABNORMAL LOW (ref 98–111)
Creatinine, Ser: 1.84 mg/dL — ABNORMAL HIGH (ref 0.44–1.00)
GFR calc Af Amer: 28 mL/min — ABNORMAL LOW (ref >60)
GFR calc non Af Amer: 25 mL/min — ABNORMAL LOW (ref >60)
Glucose, Bld: 215 mg/dL — ABNORMAL HIGH (ref 70–99)
Potassium: 4.5 mmol/L (ref 3.5–5.1)
Sodium: 136 mmol/L (ref 135–145)

## 2018-03-20 LAB — CBC
HCT: 35.8 % — ABNORMAL LOW (ref 36.0–46.0)
Hemoglobin: 10.4 g/dL — ABNORMAL LOW (ref 12.0–15.0)
MCH: 28 pg (ref 26.0–34.0)
MCHC: 29.1 g/dL — ABNORMAL LOW (ref 30.0–36.0)
MCV: 96.2 fL (ref 80.0–100.0)
Platelets: 190 10*3/uL (ref 150–400)
RBC: 3.72 MIL/uL — ABNORMAL LOW (ref 3.87–5.11)
RDW: 13.3 % (ref 11.5–15.5)
WBC: 6.1 10*3/uL (ref 4.0–10.5)
nRBC: 0 % (ref 0.0–0.2)

## 2018-03-20 LAB — BLOOD GAS, ARTERIAL
Acid-Base Excess: 4.3 mmol/L — ABNORMAL HIGH (ref 0.0–2.0)
Bicarbonate: 31.2 mmol/L — ABNORMAL HIGH (ref 20.0–28.0)
Delivery systems: POSITIVE
Drawn by: 347191
EXPIRATORY PAP: 5
FIO2: 40
Inspiratory PAP: 14
LHR: 14 {breaths}/min
O2 Saturation: 95.4 %
Patient temperature: 98.6
pCO2 arterial: 74.8 mmHg (ref 32.0–48.0)
pH, Arterial: 7.243 — ABNORMAL LOW (ref 7.350–7.450)
pO2, Arterial: 82.1 mmHg — ABNORMAL LOW (ref 83.0–108.0)

## 2018-03-20 LAB — HIV ANTIBODY (ROUTINE TESTING W REFLEX): HIV SCREEN 4TH GENERATION: NONREACTIVE

## 2018-03-20 LAB — GLUCOSE, CAPILLARY: Glucose-Capillary: 171 mg/dL — ABNORMAL HIGH (ref 70–99)

## 2018-03-20 LAB — PROCALCITONIN: Procalcitonin: <0.1 ng/mL

## 2018-03-20 MED ORDER — LORAZEPAM 2 MG/ML IJ SOLN
0.5000 mg | Freq: Four times a day (QID) | INTRAMUSCULAR | Status: DC | PRN
  Administered 2018-03-20: 0.5 mg via INTRAVENOUS
  Filled 2018-03-20: qty 1

## 2018-03-20 MED ORDER — LORAZEPAM 2 MG/ML IJ SOLN
0.5000 mg | Freq: Three times a day (TID) | INTRAMUSCULAR | Status: DC
  Administered 2018-03-20 – 2018-03-21 (×3): 0.5 mg via INTRAVENOUS
  Filled 2018-03-20 (×3): qty 1

## 2018-03-20 MED ORDER — MORPHINE SULFATE (PF) 2 MG/ML IV SOLN: 1.0000 mg | INTRAVENOUS | Status: DC | PRN

## 2018-03-20 MED ORDER — GLYCOPYRROLATE 0.2 MG/ML IJ SOLN: 0.4000 mg | Freq: Four times a day (QID) | INTRAMUSCULAR | Status: DC | PRN

## 2018-03-20 MED ORDER — LORAZEPAM 2 MG/ML IJ SOLN
1.0000 mg | INTRAMUSCULAR | Status: DC | PRN
  Administered 2018-03-20 – 2018-03-21 (×2): 1 mg via INTRAVENOUS
  Filled 2018-03-20 (×2): qty 1

## 2018-03-20 MED ORDER — MORPHINE SULFATE (PF) 2 MG/ML IV SOLN
1.0000 mg | INTRAVENOUS | Status: DC
  Administered 2018-03-20 – 2018-03-21 (×6): 1 mg via INTRAVENOUS
  Filled 2018-03-20 (×6): qty 1

## 2018-03-20 MED ORDER — GLYCOPYRROLATE 0.2 MG/ML IJ SOLN
0.2000 mg | Freq: Three times a day (TID) | INTRAMUSCULAR | Status: DC
  Administered 2018-03-20 – 2018-03-21 (×3): 0.2 mg via INTRAVENOUS
  Filled 2018-03-20 (×3): qty 1

## 2018-03-20 NOTE — Plan of Care (Signed)

## 2018-03-20 NOTE — Progress Notes (Signed)
CRITICAL VALUE ALERT  Critical Value:  ABGS, ph 7.24, co2 74.8, po2 82.1, bicarb 31.2  Date & Time Notied:  03/20/2018 5:53  Provider Notified: Broadus John  Orders Received/Actions taken Awaiting orders

## 2018-03-20 NOTE — Discharge Instructions (Signed)

## 2018-03-20 NOTE — Consult Note (Signed)
Consultation Note Date: 03/20/2018   Patient Name: Mary Doyle  DOB: August 02, 1931  MRN: [id number]  Age / Sex: 85 y.o., female  PCP: Prince Solian, MD Referring Physician: Dessa Phi, DO  Reason for Consultation: Establishing goals of care, Non pain symptom management, Pain control and Psychosocial/spiritual support  HPI/Patient Profile: 85 y.o. female  with past medical history of diastolic heart failure, history of left breast cancer, diabetes type 2, hyperlipidemia, hyperparathyroidism, hypercalcemia, A. fib on anticoagulation admitted on 03/18/2018 with increased shortness of breath and cough, weakness, decreased urinary output x2 weeks.  Chest x-ray showed pneumonia as well as small pericardial effusion.  ABG, PCO2 74.8.  Consult ordered for goals of care.   Clinical Assessment and Goals of Care: Patient seen, chart reviewed.  Had a family meeting with daughters Opal Sidles and Shauna Hugh.  Mrs. Errington's husband died at [facility] on 2018-01-28.  Family is understandably very emotional and overwhelmed but also realistic and see that their mother is likely nearing end-of-life.  She was placed on BiPAP last night secondary to an acute decline.  Opal Sidles, shares with me, that her mother was saying "I do not want this", "take this off for me".  Opal Sidles and Diane both shared that their mother has been grieving and verbalizing that she is very tired.  She is also verbalizing that she is ready to be with her husband.  Family is electing comfort care  Patient is no longer able to speak for herself.  Her healthcare proxy's are her children.  Primary spokesperson for the family is her daughter, Benn Moulder at [phone].  They are making decisions as a family    SUMMARY OF RECOMMENDATIONS   DNR DNI No further BiPAP.  Do not escalate O2 beyond nasal cannula Will DC all oral medications as patient is no longer able to  take anything by mouth Start comfort medications to ensure that patient has no respiratory distress (see below) Family in agreement to stop blood sugar checks, no further labs but are requesting to continue cardiac monitor for now.   She is comfort care  Code Status/Advance Care Planning:  DNR    Symptom Management:   Dyspnea: Start scheduled morphine as well as as needed every 1 hour as needed to ensure respiratory comfort.  Monitor for need for continuous infusion, or up titration  Anxiety: Increase Ativan dosing as well as decreasing frequency.  We will add low-dose scheduled Ativan 0.5 mg every 8 hours.  Monitor for effect  Secretions: Patient is high risk for upper airway secretions as she came in volume overloaded.  Will start scheduled Robinul  Palliative Prophylaxis:   Aspiration, Bowel Regimen, Delirium Protocol, Eye Care, Frequent Pain Assessment, Oral Care, Palliative Wound Care and Turn Reposition  Additional Recommendations (Limitations, Scope, Preferences):  Full Comfort Care  Psycho-social/Spiritual:   Desire for further Chaplaincy support:no  Additional Recommendations: Grief/Bereavement Support  Prognosis:   Hours - Days  Discharge Planning: Anticipated Hospital Death      Primary Diagnoses: Present on Admission: Marland Kitchen (  Resolved) CAP (community acquired pneumonia) . (Resolved) Hypercalcemia . (Resolved) Breast cancer of upper-outer quadrant of left female breast ([place]) . Hypertension . Atrial fibrillation  . Edema . HLD (hyperlipidemia) . AKI (acute kidney injury) ([place]) . Acute on chronic diastolic CHF (congestive heart failure) ([place])   I have reviewed the medical record, interviewed the patient and family, and examined the patient. The following aspects are pertinent.  Past Medical History:  Diagnosis Date  . Adrenal mass ([place])   . Atrial fibrillation, new onset ([place])   . Breast cancer ([place])   . Diabetes mellitus   . Glaucoma   .  Hyperlipidemia   . Hyperparathyroidism   . Hypertension   . Lump in female breast   . Renal artery aneurysm ([place])   . Renal artery stenosis [facility])    Social History   Socioeconomic History  . Marital status: Married    Spouse name: Not on file  . Number of children: Not on file  . Years of education: Not on file  . Highest education level: Not on file  Occupational History  . Not on file  Social Needs  . Financial resource strain: Not on file  . Food insecurity:    Worry: Not on file    Inability: Not on file  . Transportation needs:    Medical: Not on file    Non-medical: Not on file  Tobacco Use  . Smoking status: Never Smoker  . Smokeless tobacco: Never Used  Substance and Sexual Activity  . Alcohol use: No  . Drug use: No  . Sexual activity: Not Currently  Lifestyle  . Physical activity:    Days per week: Not on file    Minutes per session: Not on file  . Stress: Not on file  Relationships  . Social connections:    Talks on phone: Not on file    Gets together: Not on file    Attends religious service: Not on file    Active member of club or organization: Not on file    Attends meetings of clubs or organizations: Not on file    Relationship status: Not on file  Other Topics Concern  . Not on file  Social History Narrative  . Not on file   Family History  Problem Relation Age of Onset  . Diabetes Mother   . Lung cancer Sister   . Ovarian cancer Sister    Scheduled Meds: . furosemide  40 mg Intravenous Daily  . glycopyrrolate  0.2 mg Intravenous Q8H  . latanoprost  1 drop Both Eyes QHS  . LORazepam  0.5 mg Intravenous Q8H  .  morphine injection  1 mg Intravenous Q4H   Continuous Infusions: PRN Meds:.glycopyrrolate, LORazepam, morphine injection Medications Prior to Admission:  Prior to Admission medications   Medication Sig Start Date End Date Taking? Authorizing Provider  alendronate (FOSAMAX) 70 MG tablet Take 70 mg by mouth once a week. Takes on  Sundays 06/19/10  Yes [provider]  amLODipine (NORVASC) 5 MG tablet Take 5 mg by mouth daily.  07/23/10  Yes [provider]  apixaban (ELIQUIS) 5 MG TABS tablet Take 1 tablet (5 mg total) by mouth 2 (two) times daily. 12/15/13  Yes Turner, Eber Hong, MD  atorvastatin (LIPITOR) 40 MG tablet Take 40 mg by mouth daily.  05/16/14  Yes [provider]  CINNAMON PO Take 1 capsule by mouth 2 (two) times daily.   Yes [provider]  ergocalciferol (VITAMIN D2) 50000  UNITS capsule Take 50,000 Units by mouth once a week. Takes on Sundays   Yes [provider]  fluticasone (FLONASE) 50 MCG/ACT nasal spray Place 1-2 sprays into both nostrils daily.  07/23/10  Yes [provider]  furosemide (LASIX) 40 MG tablet Take 1 tablet (40 mg total) by mouth daily. 12/18/13  Yes Turner, Eber Hong, MD  glipiZIDE (GLUCOTROL XL) 5 MG 24 hr tablet Take 5 mg by mouth 2 (two) times daily with a meal.   Yes [provider]  latanoprost (XALATAN) 0.005 % ophthalmic solution Place 1 drop into both eyes at bedtime.  08/04/10  Yes [provider]  loratadine (CLARITIN) 10 MG tablet Take 10 mg by mouth daily.   Yes [provider]  LORazepam (ATIVAN) 0.5 MG tablet Take 0.5 mg by mouth daily as needed for anxiety.  12/08/12  Yes [provider]  losartan (COZAAR) 100 MG tablet Take 50 mg by mouth 2 (two) times daily.   Yes [provider]  metFORMIN (GLUCOPHAGE) 1000 MG tablet Take 1,000 mg by mouth 2 (two) times daily with a meal.  07/23/10  Yes [provider]  metoprolol (TOPROL-XL) 200 MG 24 hr tablet Take 200 mg by mouth daily.  07/23/10  Yes [provider]  Multiple Vitamins-Minerals (CENTRUM SILVER ADULT 50+ PO) Take 1 tablet by mouth daily.    Yes [provider]  polyethylene glycol (MIRALAX / GLYCOLAX) packet Take 17 g by mouth daily.   Yes [provider]  potassium chloride SA (K-DUR,KLOR-CON) 20  MEQ tablet Take 1 tablet (20 mEq total) by mouth 2 (two) times daily. 08/03/14  Yes Sueanne Margarita, MD  promethazine (PHENERGAN) 25 MG tablet Take 25 mg by mouth every 6 (six) hours as needed for nausea or vomiting.  06/19/10  Yes [provider]  ranitidine (ZANTAC) 150 MG tablet Take 150 mg by mouth 2 (two) times daily.  07/23/10  Yes [provider]  traMADol (ULTRAM) 50 MG tablet Take 50 mg by mouth 2 (two) times daily as needed (pain).   Yes [provider]  anastrozole (ARIMIDEX) 1 MG tablet TAKE 1 TABLET BY MOUTH ONCE A DAY Patient not taking: Reported on 03/18/2018 11/15/15   Nicholas Lose, MD  ONE TOUCH ULTRA TEST test strip  06/19/10   [provider]   No Known Allergies Review of Systems  Unable to perform ROS: Acuity of condition    Physical Exam Vitals signs and nursing note reviewed.  Constitutional:      General: She is in acute distress.     Appearance: She is ill-appearing.  HENT:     Head: Normocephalic and atraumatic.  Cardiovascular:     Rate and Rhythm: Tachycardia present.  Pulmonary:     Comments: Patient on BiPAP when I first entered room; increased work of breathing still present Musculoskeletal:     Right lower leg: Edema present.     Left lower leg: Edema present.  Neurological:     Comments: Opens her eyes to voice otherwise unable to test  Psychiatric:     Comments: Mild agitation noted     Vital Signs: BP 110/85 (BP Location: Left Leg)   Pulse 73   Temp 98 F (36.7 C) (Oral)   Resp 15   Ht 5' (1.524 m)   Wt 82.3 kg   SpO2 98%   BMI 35.43 kg/m  Pain Scale: 0-10   Pain Score: Asleep   SpO2: SpO2: 98 % O2  Device:SpO2: 98 % O2 Flow Rate: .O2 Flow Rate (L/min): 4 L/min  IO: Intake/output summary:   Intake/Output Summary (Last 24 hours) at 03/20/2018 1410 Last data filed at 03/20/2018 0500 Gross per 24 hour  Intake -  Output 850 ml  Net -850 ml    LBM:   Baseline Weight: Weight: 72.6 kg Most recent  weight: Weight: 82.3 kg     Palliative Assessment/Data:   Flowsheet Rows     Most Recent Value  Intake Tab  Referral Department  Hospitalist  Unit at Time of Referral  Med/Surg Unit  Palliative Care Primary Diagnosis  Sepsis/Infectious Disease  Date Notified  03/20/18  Reason for referral  Pain, Non-pain Symptom, End of Life Care Assistance, Clarify Goals of Care, Counsel Regarding Hospice, Psychosocial or Spiritual support  Date of Admission  03/18/18  Date first seen by Palliative Care  03/20/18  # of days Palliative referral response time  0 Day(s)  # of days IP prior to Palliative referral  2  Clinical Assessment  Palliative Performance Scale Score  30%  Pain Max last 24 hours  Not able to report  Pain Min Last 24 hours  Not able to report  Dyspnea Max Last 24 Hours  Not able to report  Dyspnea Min Last 24 hours  Not able to report  Nausea Max Last 24 Hours  Not able to report  Nausea Min Last 24 Hours  Not able to report  Anxiety Max Last 24 Hours  Not able to report  Anxiety Min Last 24 Hours  Not able to report  Other Max Last 24 Hours  Not able to report  Psychosocial & Spiritual Assessment  Palliative Care Outcomes  Patient/Family meeting held?  Yes  Who was at the meeting?  2 dtrs, granddaughter, grandson  Palliative Care Outcomes  Improved pain interventions, Improved non-pain symptom therapy, Changed to focus on comfort, Provided psychosocial or spiritual support, Provided end of life care assistance, Clarified goals of care  Patient/Family wishes: Interventions discontinued/not started   Mechanical Ventilation, BiPAP, Hemodialysis, Transfusion, Vasopressors, Tube feedings/TPN, PEG, NIPPV, Trach, Antibiotics  Palliative Care follow-up planned  Yes, Facility      Time In: 1200 Time Out: 1315 Time Total: 75 min Greater than 50%  of this time was spent counseling and coordinating care related to the above assessment and plan.Staffed with Dr. Maylene Roes Signed by: Dory Horn, NP   Please contact Palliative Medicine Team phone at [phone] for questions and concerns.  For individual provider: See Shea Evans

## 2018-03-20 NOTE — Progress Notes (Signed)
OT Cancellation Note  Patient Details Name: ALEXXA SABET MRN: [id number] DOB: Jul 02, 1931   Cancelled Treatment:    Reason Eval/Treat Not Completed: Medical issues which prohibited therapy;Patient not medically ready(Rapid response called last PM and not appropriate for tx.)  Darryl Nestle) Marsa Aris OTR/L Acute Rehabilitation Services Pager: [phone] Office: [phone]   Fredda Hammed 03/20/2018, 7:28 AM

## 2018-03-20 NOTE — Progress Notes (Signed)
PT Cancellation Note  Patient Details Name: Mary Doyle MRN: [id number] DOB: [birth year]   Cancelled Treatment:    Reason Eval/Treat Not Completed: Medical issues which prohibited therapy;Patient not medically ready(Rapid called in the night, pt not stable for therapy)   [place] 03/20/2018, 7:41 AM

## 2018-03-20 NOTE — Progress Notes (Signed)
PROGRESS NOTE    [name]  EPP:[id number] DOB: Jul 19, 1931 DOA: 03/18/2018 PCP: Prince Solian, MD     Brief Narrative:  Mary Doyle is a 85 yo female with past medical history significant for left breast cancer, type 2 diabetes, hyperparathyroidism, hypercalcemia, chronic atrial fibrillation on anticoagulation, renal artery stenosis.  She presents with complaints of progressively worsening shortness of breath, cough.  This is been ongoing for the past 2 weeks.  In the emergency department, she required nasal cannula O2, chest x-ray questionable for pneumonia as well as pericardial effusion.   New events last 24 hours / Subjective: Overnight, she had respiratory distress, very agitated. She received IV lasix and was placed on BiPAP as well as IV ativan. Daughter at bedside.  Patient remains confused, agitated this morning. Mittens on, BiPAP on. We discussed her rapid decline in mentation as well as respiratory status overnight. Family requesting palliative care consultation.   Assessment & Plan:   Principal Problem:   Acute on chronic diastolic CHF (congestive heart failure) (HCC) Active Problems:   Hypertension   Atrial fibrillation    Chronic anticoagulation   HLD (hyperlipidemia)   DM2 (diabetes mellitus, type 2) (HCC)   Edema   AKI (acute kidney injury) ([place])   Pressure injury of skin   Acute on chronic diastolic heart failure -Patient's baseline weight is 176 to 178 pounds.  Weight on admission 181 pounds -BNP 573 -Lasix IV -Strict I/Os -Daily weight  -Ted hose ordered   Acute hypoxemic and hypercapnic respiratory failure -Now requiring BiPAP. Confirmed DO NOT INTUBATE status with daughter. MOST form reviewed.   Acute metabolic encephalopathy -CT head negative   Questionable community-acquired pneumonia  -Procalcitonin <0.10, no leukocytosis -Chest x-ray revealed obscuration of the left hemidiaphragm, superimposed pneumonia would be difficult to  exclude -Started on ceftriaxone, azithromycin. Her dyspnea is more likely due to her HF and fluid overload state. Will discontinue antibiotics, watch fever and leukocytosis to determine infectious etiology   Questionable pericardial effusion -Echocardiogram showed trivial pericardial effusion   Chronic atrial fibrillation -Continue Eliquis -Continue toprol   HTN -Continue norvasc   HLD -Continue lipitor   AKI vs Chronic kidney disease stage III-IV -Baseline Creatinine 1 in 2016 -Cr 1.51 --> 1.49 --> 1.62 --> 1.84  -Monitor BMP   Type 2 diabetes with hyperglycemia -Hemoglobin A1c 10.1 -Hold metformin, glipizide -Start Lantus, sliding scale insulin   DVT prophylaxis: Eliquis Code Status: DNR Family Communication: Daughter at bedside  Disposition Plan: Pending palliative care consultation, patient with poor prognosis    Consultants:   Palliative care medicine   Procedures:   None  Antimicrobials:  Anti-infectives (From admission, onward)   Start     Dose/Rate Route Frequency Ordered Stop   03/19/18 1700  azithromycin (ZITHROMAX) 500 mg in sodium chloride 0.9 % 250 mL IVPB  Status:  Discontinued     500 mg 250 mL/hr over 60 Minutes Intravenous Every 24 hours 03/18/18 1729 03/19/18 1132   03/19/18 1600  cefTRIAXone (ROCEPHIN) 1 g in sodium chloride 0.9 % 100 mL IVPB  Status:  Discontinued     1 g 200 mL/hr over 30 Minutes Intravenous Every 24 hours 03/18/18 1729 03/19/18 1132   03/18/18 1615  cefTRIAXone (ROCEPHIN) 1 g in sodium chloride 0.9 % 100 mL IVPB     1 g 200 mL/hr over 30 Minutes Intravenous  Once 03/18/18 1608 03/18/18 1713   03/18/18 1615  azithromycin (ZITHROMAX) 500 mg in sodium chloride 0.9 % 250 mL IVPB  500 mg 250 mL/hr over 60 Minutes Intravenous  Once 03/18/18 1608 03/18/18 1805       Objective: Vitals:   03/19/18 2111 03/19/18 2257 03/20/18 0755 03/20/18 0807  BP:  131/79  110/85  Pulse: 85 85 80 79  Resp: (!) 21 (!) 23 19 19   Temp:   98 F (36.7 C)    TempSrc:  Oral    SpO2:  90% 98% 97%  Weight:      Height:        Intake/Output Summary (Last 24 hours) at 03/20/2018 1019 Last data filed at 03/20/2018 0500 Gross per 24 hour  Intake -  Output 850 ml  Net -850 ml   Filed Weights   03/18/18 1315 03/18/18 1820  Weight: 72.6 kg 82.3 kg    Examination: General exam: Appears agitated, restless  Respiratory system: Clear to auscultation anteriorly, on BiPAP mask, diminished bases  Cardiovascular system: S1 & S2 heard, irreg rhythm rate 60s Gastrointestinal system: Abdomen is nondistended, soft and nontender. No organomegaly or masses felt. Normal bowel sounds heard. Central nervous system: Unable to test Extremities: L>R edema  Psychiatry: Confused, agitated    Data Reviewed: I have personally reviewed following labs and imaging studies  CBC: Recent Labs  Lab 03/18/18 1352 03/18/18 1401 03/19/18 0301 03/20/18 0645  WBC 9.2  --  7.2 6.1  NEUTROABS 6.8  --  4.9  --   HGB 12.5 13.6 10.4* 10.4*  HCT 42.2 40.0 34.9* 35.8*  MCV 95.7  --  95.9 96.2  PLT 264  --  200 478   Basic Metabolic Panel: Recent Labs  Lab 03/18/18 1352 03/18/18 1401 03/18/18 2037 03/19/18 0301 03/20/18 0645  NA 134* 132* 136 138 136  K 5.1 5.0 4.7 4.9 4.5  CL 93*  --  97* 101 97*  CO2 29  --  30 31 30   GLUCOSE 260*  --  209* 147* 215*  BUN 44*  --  43* 47* 50*  CREATININE 1.51*  --  1.49* 1.62* 1.84*  CALCIUM 10.8*  --  10.2 9.7 9.9  MG  --   --  2.0  --   --    GFR: Estimated Creatinine Clearance: 21.2 mL/min (A) (by C-G formula based on SCr of 1.84 mg/dL (H)). Liver Function Tests: Recent Labs  Lab 03/19/18 0301  AST 12*  ALT 16  ALKPHOS 44  BILITOT 0.4  PROT 4.9*  ALBUMIN 2.5*   No results for input(s): LIPASE, AMYLASE in the last 168 hours. No results for input(s): AMMONIA in the last 168 hours. Coagulation Profile: No results for input(s): INR, PROTIME in the last 168 hours. Cardiac Enzymes: Recent Labs    Lab 03/18/18 1352  TROPONINI <0.03   BNP (last 3 results) No results for input(s): PROBNP in the last 8760 hours. HbA1C: Recent Labs    03/18/18 2031  HGBA1C 10.1*   CBG: Recent Labs  Lab 03/18/18 2106 03/19/18 0734 03/19/18 1216 03/19/18 1642 03/19/18 2105  GLUCAP 212* 146* 183* 193* 216*   Lipid Profile: No results for input(s): CHOL, HDL, LDLCALC, TRIG, CHOLHDL, LDLDIRECT in the last 72 hours. Thyroid Function Tests: No results for input(s): TSH, T4TOTAL, FREET4, T3FREE, THYROIDAB in the last 72 hours. Anemia Panel: No results for input(s): VITAMINB12, FOLATE, FERRITIN, TIBC, IRON, RETICCTPCT in the last 72 hours. Sepsis Labs: Recent Labs  Lab 03/18/18 2037 03/19/18 0301 03/20/18 0645  PROCALCITON <0.10 0.10 <0.10    Recent Results (from the past 240 hour(s))  Culture,  blood (routine x 2) Call MD if unable to obtain prior to antibiotics being given     Status: None (Preliminary result)   Collection Time: 03/18/18  8:31 PM  Result Value Ref Range Status   Specimen Description BLOOD RIGHT ANTECUBITAL  Final   Special Requests   Final    BOTTLES DRAWN AEROBIC AND ANAEROBIC Blood Culture adequate volume   Culture   Final    NO GROWTH 2 DAYS Performed at [facility], [address] [place], [place] [address]    Report Status PENDING  Incomplete  Culture, blood (routine x 2) Call MD if unable to obtain prior to antibiotics being given     Status: None (Preliminary result)   Collection Time: 03/18/18  8:43 PM  Result Value Ref Range Status   Specimen Description BLOOD RIGHT HAND  Final   Special Requests   Final    BOTTLES DRAWN AEROBIC ONLY Blood Culture results may not be optimal due to an inadequate volume of blood received in culture bottles   Culture   Final    NO GROWTH 2 DAYS Performed at [facility], [place] [address], [place], [place] [address]    Report Status PENDING  Incomplete       Radiology Studies: Dg Chest 2 View  Result  Date: 03/18/2018 CLINICAL DATA:  Cough and generalized weakness. Evaluate for pneumonia. EXAM: CHEST - 2 VIEW COMPARISON:  09/04/2010 FINDINGS: Interval increase in the size of the cardiopericardial silhouette raising suspicion for possible pericardial effusion or dilated cardiomyopathy. Obscuration of the left hemidiaphragm and left heart border from presumed left effusion and atelectasis. Superimposed pneumonia would be difficult to entirely exclude at the left lung base. Mild interstitial edema is seen. Pleural thickening at the right lung apex is stable. Moderate aortic atherosclerosis is identified at the arch without definite aneurysm. Osteoarthritis of the included acromioclavicular and glenohumeral joints. Small right pleural effusion is noted posteriorly. Osteopenia and thoracic spondylosis is noted of the dorsal spine. IMPRESSION: 1. Interval increase in the size of the cardiopericardial silhouette raising suspicion for possible pericardial effusion. Dilated cardiomyopathy is also possibility. 2. Obscuration of the left hemidiaphragm and left heart border from left effusion and atelectasis. Superimposed pneumonia would be difficult to entirely exclude at the left lung base. Small posterior right pleural effusion. 3. Mild interstitial edema. Electronically Signed   By: Ashley Royalty M.D.   On: 03/18/2018 15:43   Ct Head Wo Contrast  Result Date: 03/19/2018 CLINICAL DATA:  Altered mental status. EXAM: CT HEAD WITHOUT CONTRAST TECHNIQUE: Contiguous axial images were obtained from the base of the skull through the vertex without intravenous contrast. COMPARISON:  None. FINDINGS: Due to patient's altered mental status, there is moderate motion artifact despite a second imaging acquisition. Brain: No acute large territory infarct, intracranial hemorrhage, midline shift, or extra-axial fluid collection is identified. The ventricles and sulci are within normal limits for age. Vascular: Calcified atherosclerosis  at the skull base. Skull: No fracture or suspicious osseous lesion. Sinuses/Orbits: Paranasal sinuses and mastoid air cells are clear. Bilateral cataract extraction. Other: None. IMPRESSION: Motion degraded examination without evidence of acute intracranial abnormality. Electronically Signed   By: Logan Bores M.D.   On: 03/19/2018 14:24   Dg Chest Port 1 View  Result Date: 03/19/2018 CLINICAL DATA:  Hypoxia. EXAM: PORTABLE CHEST 1 VIEW COMPARISON:  March 18 2018 FINDINGS: Enlargement of the cardiac silhouette. Layering effusion with underlying opacity on the left persists. There may be a  tiny effusion on the right. No other changes. IMPRESSION: 1. Enlargement of the cardiac silhouette, stable since yesterday. 2. Layering effusion with underlying opacity on the left. Possible tiny effusion on the right. Electronically Signed   By: Dorise Bullion III M.D   On: 03/19/2018 20:56      Scheduled Meds: . amLODipine  5 mg Oral Daily  . apixaban  2.5 mg Oral BID  . atorvastatin  40 mg Oral Daily  . famotidine  20 mg Oral Daily  . fluticasone  1-2 spray Each Nare Daily  . furosemide  40 mg Intravenous Daily  . insulin aspart  0-15 Units Subcutaneous TID WC  . insulin aspart  0-5 Units Subcutaneous QHS  . latanoprost  1 drop Both Eyes QHS  . loratadine  10 mg Oral Daily  . metoprolol  200 mg Oral Daily  . polyethylene glycol  17 g Oral Daily   Continuous Infusions:    LOS: 2 days    Time spent: 50 minutes   Dessa Phi, DO [facility] [email or web address] 03/20/2018, 10:19 AM

## 2018-03-21 DIAGNOSIS — I3139 Other pericardial effusion (noninflammatory): Secondary | ICD-10-CM

## 2018-03-21 DIAGNOSIS — J9601 Acute respiratory failure with hypoxia: Secondary | ICD-10-CM

## 2018-03-21 DIAGNOSIS — I313 Pericardial effusion (noninflammatory): Secondary | ICD-10-CM

## 2018-03-21 DIAGNOSIS — G9341 Metabolic encephalopathy: Secondary | ICD-10-CM

## 2018-03-21 DIAGNOSIS — J9602 Acute respiratory failure with hypercapnia: Secondary | ICD-10-CM

## 2018-03-21 MED ORDER — MORPHINE SULFATE (CONCENTRATE) 10 MG/0.5ML PO SOLN
10.0000 mg | ORAL | Status: AC
  Administered 2018-03-21: 10 mg via SUBLINGUAL
  Filled 2018-03-21: qty 0.5

## 2018-03-23 LAB — CULTURE, BLOOD (ROUTINE X 2)
Culture: NO GROWTH
Culture: NO GROWTH
SPECIAL REQUESTS: ADEQUATE

## 2018-04-03 NOTE — Progress Notes (Signed)
Nutrition Brief Note  Pt identified on the Malnutrition Screening Tool Report. Pt transitioned to comfort care; family would like [employer]. No nutrition interventions warranted at this time.  Please consult as needed.   Arthur Holms, RD, LDN Pager #: [phone] After-Hours Pager #: [phone]

## 2018-04-03 NOTE — Clinical Social Work Note (Signed)
CSW received a call from [place] from [facility] and at this time pt's family would like to proceed with [facility]. Referral provided to [facility] with [facility].   [place], [place]

## 2018-04-03 NOTE — Progress Notes (Signed)
[employer] [facility] available for patient today. Paper work completed with daughters at bedside for transfer today. Discharge summary has been sent. CSW [place] aware.   RN please call report to [phone].  Thank you for you help.  Erling Conte, [place]

## 2018-04-03 NOTE — Clinical Social Work Note (Signed)
Pt will transfer to [facility] today--family agreeable. Harmon Pier with [facility] to do ppwk with family at bedside.  [place], [place]

## 2018-04-03 NOTE — Progress Notes (Signed)
Morphine sublingual given prior to transport by [facility].  Family left to meet patient at hospice home.  Patient alert and moaning.  No respiratory distress.

## 2018-04-03 NOTE — Progress Notes (Signed)
PROGRESS NOTE    [name]  MWU:[id number] DOB: 02-09-1931 DOA: 03/18/2018 PCP: Prince Solian, MD     Brief Narrative:  Mary Doyle is a 85 yo female with past medical history significant for left breast cancer, type 2 diabetes, hyperparathyroidism, hypercalcemia, chronic atrial fibrillation on anticoagulation, renal artery stenosis.  She presents with complaints of progressively worsening shortness of breath, cough.  This is been ongoing for the past 2 weeks.  In the emergency department, she required nasal cannula O2, chest x-ray questionable for pneumonia as well as pericardial effusion.   New events last 24 hours / Subjective: Daughters at bedside. Extended discussion regarding patient's pericardial effusion (trivial per echo report), heart failure, fluid overload, respiratory failure, confusion/mental status change. Daughters state patient has voiced hunger.   Assessment & Plan:   Principal Problem:   Acute on chronic diastolic CHF (congestive heart failure) (HCC) Active Problems:   Hypertension   Atrial fibrillation    Chronic anticoagulation   HLD (hyperlipidemia)   DM2 (diabetes mellitus, type 2) (HCC)   Edema   Community acquired pneumonia   AKI (acute kidney injury) ([place])   Pressure injury of skin   Shortness of breath   Goals of care, counseling/discussion   Palliative care by specialist   Terminal care   Acute on chronic diastolic heart failure Acute hypoxemic and hypercapnic respiratory failure Acute metabolic encephalopathy Trivial pericardial effusion Chronic atrial fibrillation HTN HLD AKI vs Chronic kidney disease stage III-IV Type 2 diabetes with hyperglycemia  Family met with palliative care, decided for comfort care.    Consultants:   Palliative care medicine   Procedures:   None  Antimicrobials:  Anti-infectives (From admission, onward)   Start     Dose/Rate Route Frequency Ordered Stop   03/19/18 1700  azithromycin  (ZITHROMAX) 500 mg in sodium chloride 0.9 % 250 mL IVPB  Status:  Discontinued     500 mg 250 mL/hr over 60 Minutes Intravenous Every 24 hours 03/18/18 1729 03/19/18 1132   03/19/18 1600  cefTRIAXone (ROCEPHIN) 1 g in sodium chloride 0.9 % 100 mL IVPB  Status:  Discontinued     1 g 200 mL/hr over 30 Minutes Intravenous Every 24 hours 03/18/18 1729 03/19/18 1132   03/18/18 1615  cefTRIAXone (ROCEPHIN) 1 g in sodium chloride 0.9 % 100 mL IVPB     1 g 200 mL/hr over 30 Minutes Intravenous  Once 03/18/18 1608 03/18/18 1713   03/18/18 1615  azithromycin (ZITHROMAX) 500 mg in sodium chloride 0.9 % 250 mL IVPB     500 mg 250 mL/hr over 60 Minutes Intravenous  Once 03/18/18 1608 03/18/18 1805       Objective: Vitals:   03/19/18 2257 03/20/18 0755 03/20/18 0807 03/20/18 1123  BP: 131/79  110/85   Pulse: 85 80 79 73  Resp: (!) _0 Temp: 98 F (36.7 C)     TempSrc: Oral     SpO2: 90% 98% 97% 98%  Weight:      Height:       No intake or output data in the 24 hours ending 04-10-18 1042 Filed Weights   03/18/18 1315 03/18/18 1820  Weight: 72.6 kg 82.3 kg   Examination: General exam: Appears calm and comfortable, sleeping    Data Reviewed: I have personally reviewed following labs and imaging studies  CBC: Recent Labs  Lab 03/18/18 1352 03/18/18 1401 03/19/18 0301 03/20/18 0645  WBC 9.2  --  7.2 6.1  NEUTROABS  6.8  --  4.9  --   HGB 12.5 13.6 10.4* 10.4*  HCT 42.2 40.0 34.9* 35.8*  MCV 95.7  --  95.9 96.2  PLT 264  --  200 174   Basic Metabolic Panel: Recent Labs  Lab 03/18/18 1352 03/18/18 1401 03/18/18 2037 03/19/18 0301 03/20/18 0645  NA 134* 132* 136 138 136  K 5.1 5.0 4.7 4.9 4.5  CL 93*  --  97* 101 97*  CO2 29  --  _0 GLUCOSE 260*  --  209* 147* 215*  BUN 44*  --  43* 47* 50*  CREATININE 1.51*  --  1.49* 1.62* 1.84*  CALCIUM 10.8*  --  10.2 9.7 9.9  MG  --   --  2.0  --   --    GFR: Estimated Creatinine Clearance: 21.2 mL/min (A) (by  C-G formula based on SCr of 1.84 mg/dL (H)). Liver Function Tests: Recent Labs  Lab 03/19/18 0301  AST 12*  ALT 16  ALKPHOS 44  BILITOT 0.4  PROT 4.9*  ALBUMIN 2.5*   No results for input(s): LIPASE, AMYLASE in the last 168 hours. No results for input(s): AMMONIA in the last 168 hours. Coagulation Profile: No results for input(s): INR, PROTIME in the last 168 hours. Cardiac Enzymes: Recent Labs  Lab 03/18/18 1352  TROPONINI <0.03   BNP (last 3 results) No results for input(s): PROBNP in the last 8760 hours. HbA1C: Recent Labs    03/18/18 2031  HGBA1C 10.1*   CBG: Recent Labs  Lab 03/19/18 0734 03/19/18 1216 03/19/18 1642 03/19/18 2105 03/20/18 1149  GLUCAP 146* 183* 193* 216* 171*   Lipid Profile: No results for input(s): CHOL, HDL, LDLCALC, TRIG, CHOLHDL, LDLDIRECT in the last 72 hours. Thyroid Function Tests: No results for input(s): TSH, T4TOTAL, FREET4, T3FREE, THYROIDAB in the last 72 hours. Anemia Panel: No results for input(s): VITAMINB12, FOLATE, FERRITIN, TIBC, IRON, RETICCTPCT in the last 72 hours. Sepsis Labs: Recent Labs  Lab 03/18/18 2037 03/19/18 0301 03/20/18 0645  PROCALCITON <0.10 0.10 <0.10    Recent Results (from the past 240 hour(s))  Culture, blood (routine x 2) Call MD if unable to obtain prior to antibiotics being given     Status: None (Preliminary result)   Collection Time: 03/18/18  8:31 PM  Result Value Ref Range Status   Specimen Description BLOOD RIGHT ANTECUBITAL  Final   Special Requests   Final    BOTTLES DRAWN AEROBIC AND ANAEROBIC Blood Culture adequate volume   Culture   Final    NO GROWTH 2 DAYS Performed at [facility], [place] [address] [place], [place] [address]    Report Status PENDING  Incomplete  Culture, blood (routine x 2) Call MD if unable to obtain prior to antibiotics being given     Status: None (Preliminary result)   Collection Time: 03/18/18  8:43 PM  Result Value Ref Range Status   Specimen  Description BLOOD RIGHT HAND  Final   Special Requests   Final    BOTTLES DRAWN AEROBIC ONLY Blood Culture results may not be optimal due to an inadequate volume of blood received in culture bottles   Culture   Final    NO GROWTH 2 DAYS Performed at [facility], [place] [address] [place], [place] [address]    Report Status PENDING  Incomplete       Radiology Studies: Ct Head Wo Contrast  Result Date: 03/19/2018 CLINICAL DATA:  Altered mental  status. EXAM: CT HEAD WITHOUT CONTRAST TECHNIQUE: Contiguous axial images were obtained from the base of the skull through the vertex without intravenous contrast. COMPARISON:  None. FINDINGS: Due to patient's altered mental status, there is moderate motion artifact despite a second imaging acquisition. Brain: No acute large territory infarct, intracranial hemorrhage, midline shift, or extra-axial fluid collection is identified. The ventricles and sulci are within normal limits for age. Vascular: Calcified atherosclerosis at the skull base. Skull: No fracture or suspicious osseous lesion. Sinuses/Orbits: Paranasal sinuses and mastoid air cells are clear. Bilateral cataract extraction. Other: None. IMPRESSION: Motion degraded examination without evidence of acute intracranial abnormality. Electronically Signed   By: Logan Bores M.D.   On: 03/19/2018 14:24   Dg Chest Port 1 View  Result Date: 03/19/2018 CLINICAL DATA:  Hypoxia. EXAM: PORTABLE CHEST 1 VIEW COMPARISON:  March 18 2018 FINDINGS: Enlargement of the cardiac silhouette. Layering effusion with underlying opacity on the left persists. There may be a tiny effusion on the right. No other changes. IMPRESSION: 1. Enlargement of the cardiac silhouette, stable since yesterday. 2. Layering effusion with underlying opacity on the left. Possible tiny effusion on the right. Electronically Signed   By: Dorise Bullion III M.D   On: 03/19/2018 20:56      Scheduled Meds: . glycopyrrolate  0.2 mg  Intravenous Q8H  . latanoprost  1 drop Both Eyes QHS  . LORazepam  0.5 mg Intravenous Q8H  .  morphine injection  1 mg Intravenous Q4H   Continuous Infusions:    LOS: 3 days    Time spent: 20 minutes   Dessa Phi, DO [facility] [email or web address] Apr 18, 2018, 10:42 AM

## 2018-04-03 NOTE — Discharge Summary (Signed)
Physician Discharge Summary  Mary Doyle KGM:[id number] DOB: 09-25-1931 DOA: 03/18/2018  PCP: Prince Solian, MD  Admit date: 03/18/2018 Discharge date: 2018/04/15  Admitted From: Home Disposition:  Hospice  CODE STATUS: DNR   Brief/Interim Summary: Mary Doyle is a 85 yo female with past medical history significant for left breast cancer, type 2 diabetes, hyperparathyroidism, hypercalcemia, chronic atrial fibrillation on anticoagulation, renal artery stenosis.  She presents with complaints of progressively worsening shortness of breath, cough.  This is been ongoing for the past 2 weeks.  In the emergency department, she required nasal cannula O2, chest x-ray questionable for pneumonia as well as pericardial effusion. Overnight on 2/15, she underwent respiratory distress, became very agitated. She required BiPAP for respiratory support. After discussion with family and palliative care consultation, family decided to pursue comfort care and hospice.   Discharge Diagnoses:  Principal Problem:   Acute on chronic diastolic CHF (congestive heart failure) (HCC) Active Problems:   Hypertension   Atrial fibrillation    Chronic anticoagulation   HLD (hyperlipidemia)   DM2 (diabetes mellitus, type 2) (HCC)   Edema   AKI (acute kidney injury) ([place])   Pressure injury of skin   Shortness of breath   Goals of care, counseling/discussion   Palliative care by specialist   Terminal care   Pericardial effusion   Acute metabolic encephalopathy   Acute respiratory failure with hypoxia and hypercapnia (HCC)  Discharge Instructions   Allergies as of 04/15/2018   No Known Allergies     Medication List    STOP taking these medications   alendronate 70 MG tablet Commonly known as:  FOSAMAX   amLODipine 5 MG tablet Commonly known as:  NORVASC   anastrozole 1 MG tablet Commonly known as:  ARIMIDEX   apixaban 5 MG Tabs tablet Commonly known as:  ELIQUIS   atorvastatin 40 MG  tablet Commonly known as:  LIPITOR   CENTRUM SILVER ADULT 50+ PO   CINNAMON PO   ergocalciferol 1.25 MG (50000 UT) capsule Commonly known as:  VITAMIN D2   fluticasone 50 MCG/ACT nasal spray Commonly known as:  FLONASE   furosemide 40 MG tablet Commonly known as:  LASIX   glipiZIDE 5 MG 24 hr tablet Commonly known as:  GLUCOTROL XL   latanoprost 0.005 % ophthalmic solution Commonly known as:  XALATAN   loratadine 10 MG tablet Commonly known as:  CLARITIN   LORazepam 0.5 MG tablet Commonly known as:  ATIVAN   losartan 100 MG tablet Commonly known as:  COZAAR   metFORMIN 1000 MG tablet Commonly known as:  GLUCOPHAGE   metoprolol 200 MG 24 hr tablet Commonly known as:  TOPROL-XL   ONE TOUCH ULTRA TEST test strip Generic drug:  glucose blood   polyethylene glycol packet Commonly known as:  MIRALAX / GLYCOLAX   potassium chloride SA 20 MEQ tablet Commonly known as:  K-DUR,KLOR-CON   promethazine 25 MG tablet Commonly known as:  PHENERGAN   ranitidine 150 MG tablet Commonly known as:  ZANTAC   traMADol 50 MG tablet Commonly known as:  ULTRAM       No Known Allergies  Consultations:  Palliative care    Procedures/Studies: Dg Chest 2 View  Result Date: 03/18/2018 CLINICAL DATA:  Cough and generalized weakness. Evaluate for pneumonia. EXAM: CHEST - 2 VIEW COMPARISON:  09/04/2010 FINDINGS: Interval increase in the size of the cardiopericardial silhouette raising suspicion for possible pericardial effusion or dilated cardiomyopathy. Obscuration of the left hemidiaphragm and left heart  border from presumed left effusion and atelectasis. Superimposed pneumonia would be difficult to entirely exclude at the left lung base. Mild interstitial edema is seen. Pleural thickening at the right lung apex is stable. Moderate aortic atherosclerosis is identified at the arch without definite aneurysm. Osteoarthritis of the included acromioclavicular and glenohumeral joints.  Small right pleural effusion is noted posteriorly. Osteopenia and thoracic spondylosis is noted of the dorsal spine. IMPRESSION: 1. Interval increase in the size of the cardiopericardial silhouette raising suspicion for possible pericardial effusion. Dilated cardiomyopathy is also possibility. 2. Obscuration of the left hemidiaphragm and left heart border from left effusion and atelectasis. Superimposed pneumonia would be difficult to entirely exclude at the left lung base. Small posterior right pleural effusion. 3. Mild interstitial edema. Electronically Signed   By: Ashley Royalty M.D.   On: 03/18/2018 15:43   Ct Head Wo Contrast  Result Date: 03/19/2018 CLINICAL DATA:  Altered mental status. EXAM: CT HEAD WITHOUT CONTRAST TECHNIQUE: Contiguous axial images were obtained from the base of the skull through the vertex without intravenous contrast. COMPARISON:  None. FINDINGS: Due to patient's altered mental status, there is moderate motion artifact despite a second imaging acquisition. Brain: No acute large territory infarct, intracranial hemorrhage, midline shift, or extra-axial fluid collection is identified. The ventricles and sulci are within normal limits for age. Vascular: Calcified atherosclerosis at the skull base. Skull: No fracture or suspicious osseous lesion. Sinuses/Orbits: Paranasal sinuses and mastoid air cells are clear. Bilateral cataract extraction. Other: None. IMPRESSION: Motion degraded examination without evidence of acute intracranial abnormality. Electronically Signed   By: Logan Bores M.D.   On: 03/19/2018 14:24   Dg Chest Port 1 View  Result Date: 03/19/2018 CLINICAL DATA:  Hypoxia. EXAM: PORTABLE CHEST 1 VIEW COMPARISON:  March 18 2018 FINDINGS: Enlargement of the cardiac silhouette. Layering effusion with underlying opacity on the left persists. There may be a tiny effusion on the right. No other changes. IMPRESSION: 1. Enlargement of the cardiac silhouette, stable since  yesterday. 2. Layering effusion with underlying opacity on the left. Possible tiny effusion on the right. Electronically Signed   By: Dorise Bullion III M.D   On: 03/19/2018 20:56       Discharge Exam: Vitals:   03/20/18 0807 03/20/18 1123  BP: 110/85   Pulse: 79 73  Resp: 19 15  Temp:    SpO2: 97% 98%    General: Pt is comfortable appearing, sleeping     The results of significant diagnostics from this hospitalization (including imaging, microbiology, ancillary and laboratory) are listed below for reference.     Microbiology: Recent Results (from the past 240 hour(s))  Culture, blood (routine x 2) Call MD if unable to obtain prior to antibiotics being given     Status: None (Preliminary result)   Collection Time: 03/18/18  8:31 PM  Result Value Ref Range Status   Specimen Description BLOOD RIGHT ANTECUBITAL  Final   Special Requests   Final    BOTTLES DRAWN AEROBIC AND ANAEROBIC Blood Culture adequate volume   Culture   Final    NO GROWTH 2 DAYS Performed at [facility], [address] [place], [place] [address]    Report Status PENDING  Incomplete  Culture, blood (routine x 2) Call MD if unable to obtain prior to antibiotics being given     Status: None (Preliminary result)   Collection Time: 03/18/18  8:43 PM  Result Value Ref Range Status   Specimen Description BLOOD RIGHT HAND  Final   Special Requests   Final    BOTTLES DRAWN AEROBIC ONLY Blood Culture results may not be optimal due to an inadequate volume of blood received in culture bottles   Culture   Final    NO GROWTH 2 DAYS Performed at [facility], [place] [address] [place], [place] [address]    Report Status PENDING  Incomplete     Labs: BNP (last 3 results) Recent Labs    03/18/18 2200  BNP 778.2*   Basic Metabolic Panel: Recent Labs  Lab 03/18/18 1352 03/18/18 1401 03/18/18 2037 03/19/18 0301 03/20/18 0645  NA 134* 132* 136 138 136  K 5.1 5.0 4.7 4.9 4.5  CL 93*  --  97*  101 97*  CO2 29  --  30 31 30   GLUCOSE 260*  --  209* 147* 215*  BUN 44*  --  43* 47* 50*  CREATININE 1.51*  --  1.49* 1.62* 1.84*  CALCIUM 10.8*  --  10.2 9.7 9.9  MG  --   --  2.0  --   --    Liver Function Tests: Recent Labs  Lab 03/19/18 0301  AST 12*  ALT 16  ALKPHOS 44  BILITOT 0.4  PROT 4.9*  ALBUMIN 2.5*   No results for input(s): LIPASE, AMYLASE in the last 168 hours. No results for input(s): AMMONIA in the last 168 hours. CBC: Recent Labs  Lab 03/18/18 1352 03/18/18 1401 03/19/18 0301 03/20/18 0645  WBC 9.2  --  7.2 6.1  NEUTROABS 6.8  --  4.9  --   HGB 12.5 13.6 10.4* 10.4*  HCT 42.2 40.0 34.9* 35.8*  MCV 95.7  --  95.9 96.2  PLT 264  --  200 190   Cardiac Enzymes: Recent Labs  Lab 03/18/18 1352  TROPONINI <0.03   BNP: Invalid input(s): POCBNP CBG: Recent Labs  Lab 03/19/18 0734 03/19/18 1216 03/19/18 1642 03/19/18 2105 03/20/18 1149  GLUCAP 146* 183* 193* 216* 171*   D-Dimer No results for input(s): DDIMER in the last 72 hours. Hgb A1c Recent Labs    03/18/18 2031  HGBA1C 10.1*   Lipid Profile No results for input(s): CHOL, HDL, LDLCALC, TRIG, CHOLHDL, LDLDIRECT in the last 72 hours. Thyroid function studies No results for input(s): TSH, T4TOTAL, T3FREE, THYROIDAB in the last 72 hours.  Invalid input(s): FREET3 Anemia work up No results for input(s): VITAMINB12, FOLATE, FERRITIN, TIBC, IRON, RETICCTPCT in the last 72 hours. Urinalysis No results found for: COLORURINE, APPEARANCEUR, [place], [place], [place], [place], [place], [place], PROTEINUR, UROBILINOGEN, NITRITE, LEUKOCYTESUR Sepsis Labs Invalid input(s): PROCALCITONIN,  WBC,  LACTICIDVEN Microbiology Recent Results (from the past 240 hour(s))  Culture, blood (routine x 2) Call MD if unable to obtain prior to antibiotics being given     Status: None (Preliminary result)   Collection Time: 03/18/18  8:31 PM  Result Value Ref Range Status   Specimen Description BLOOD  RIGHT ANTECUBITAL  Final   Special Requests   Final    BOTTLES DRAWN AEROBIC AND ANAEROBIC Blood Culture adequate volume   Culture   Final    NO GROWTH 2 DAYS Performed at [facility], [address] [place], [place] [address]    Report Status PENDING  Incomplete  Culture, blood (routine x 2) Call MD if unable to obtain prior to antibiotics being given     Status: None (Preliminary result)   Collection Time: 03/18/18  8:43 PM  Result Value Ref Range Status  Specimen Description BLOOD RIGHT HAND  Final   Special Requests   Final    BOTTLES DRAWN AEROBIC ONLY Blood Culture results may not be optimal due to an inadequate volume of blood received in culture bottles   Culture   Final    NO GROWTH 2 DAYS Performed at [facility], [place] [address], [place], [place] [address]    Report Status PENDING  Incomplete     Patient was seen and examined on the day of discharge and was found to be in stable condition. Time coordinating discharge: 40 minutes including assessment and coordination of care, as well as examination of the patient.   SIGNED:  Dessa Phi, DO [facility] [email or web address] 2018-04-09, 11:51 AM

## 2018-04-03 NOTE — Clinical Social Work Note (Signed)
Clinical Social Worker facilitated patient discharge including contacting patient family and facility to confirm patient discharge plans.  Clinical information faxed to facility and family agreeable with plan.  CSW arranged ambulance transport via [facility] to [facility].  RN to call [phone] for report prior to discharge.  Clinical Social Worker will sign off for now as social work intervention is no longer needed. Please consult [place] again if new need arises.  [place], [place]

## 2018-04-03 NOTE — Care Management Important Message (Signed)
Important Message  Patient Details  Name: Mary Doyle MRN: [id number] Date of Birth: 1931/06/08   Medicare Important Message Given:  Yes    Orbie Pyo 04-17-18, 3:20 PM

## 2018-04-03 DEATH — deceased

## 2018-06-03 NOTE — Progress Notes (Signed)
Palliative:  I have met today at Ms. Saffo's bedside. She is at end of life. Full comfort care in place. Discussed with family transition to hospice facility and they agree. CSW notified.   No charge  Vinie Sill, NP Palliative Medicine Team Pager # [phone] (M-F 8a-5p) Team Phone # [phone] (Nights/Weekends)

## 2020-06-08 IMAGING — CT CT HEAD W/O CM
3 of 7 series · 15 of 47 positions shown, 18 images · non-contrast
Comparison: None.

CLINICAL DATA: Altered mental status.

EXAM:
CT HEAD WITHOUT CONTRAST
TECHNIQUE: Contiguous axial images were obtained from the base of the skull
through the vertex without intravenous contrast.

[Series 5: head 2.0 h70h · axial · 0.41mm/px · z∈[-402,-272]mm · 10 of 75 slices shown, 13 images]
[im 5/75  brain]
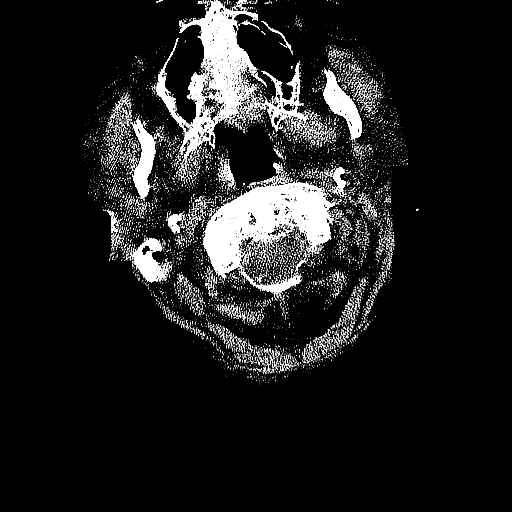
[im 5/75  bone]
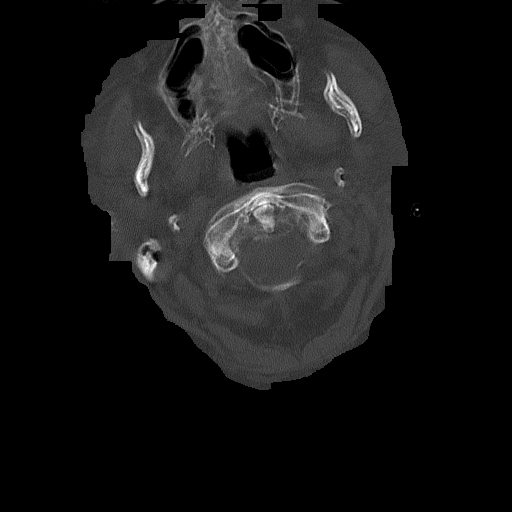
[im 14/75  brain]
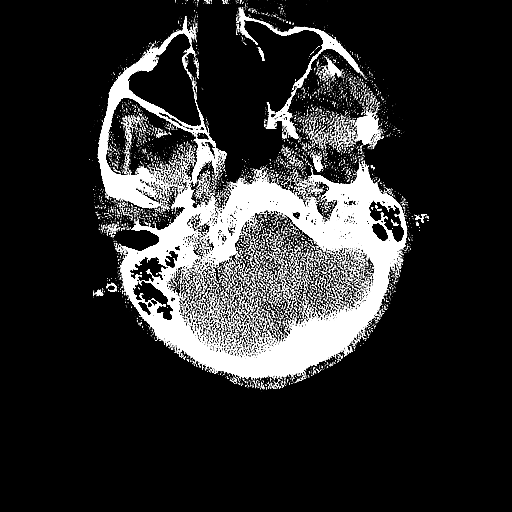
[im 19/75  brain]
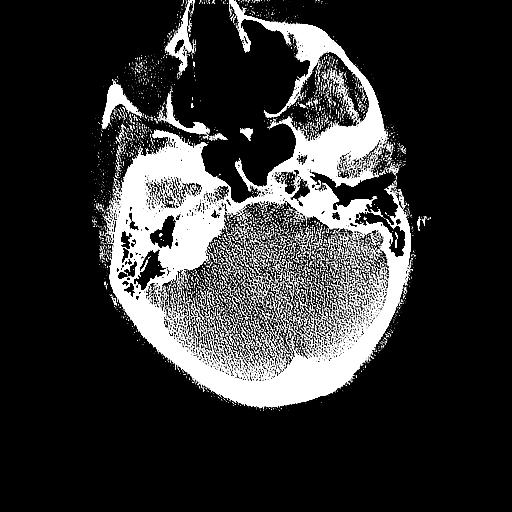
[im 28/75  brain]
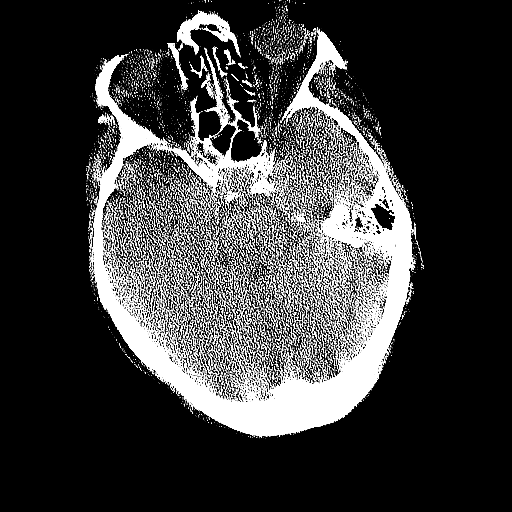
[im 33/75  brain]
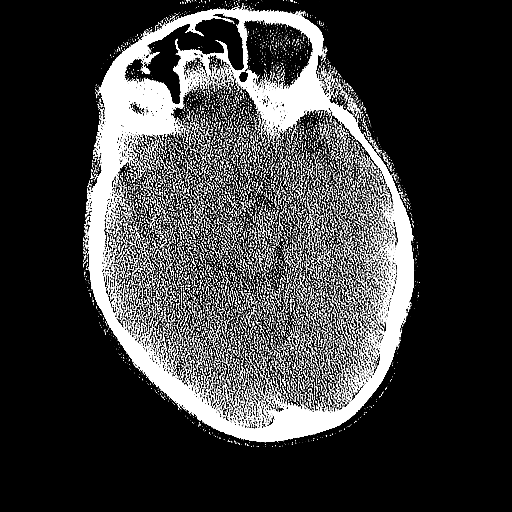
[im 33/75  bone]
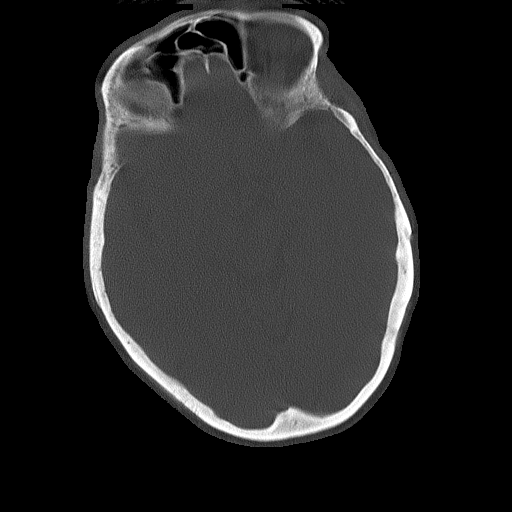
[im 42/75  brain]
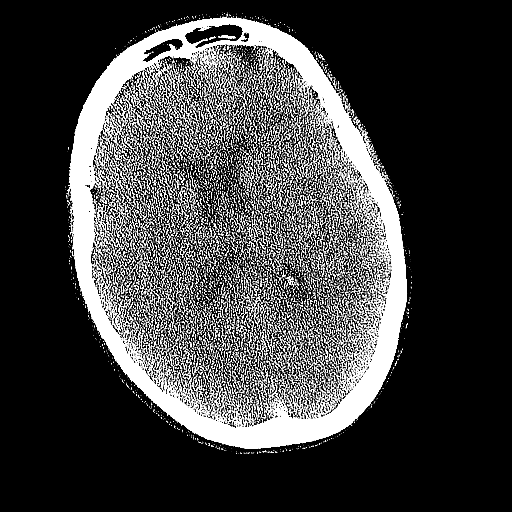
[im 47/75  brain]
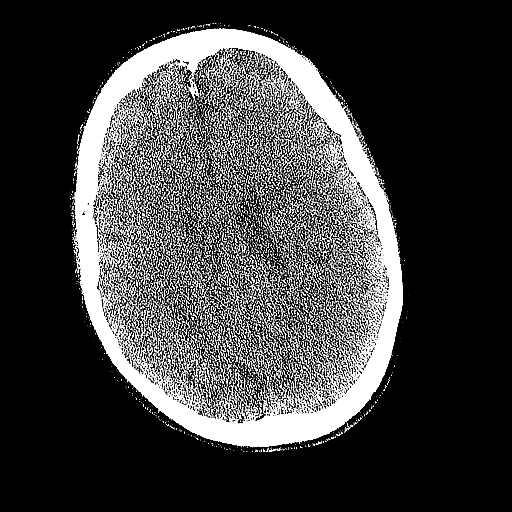
[im 56/75  brain]
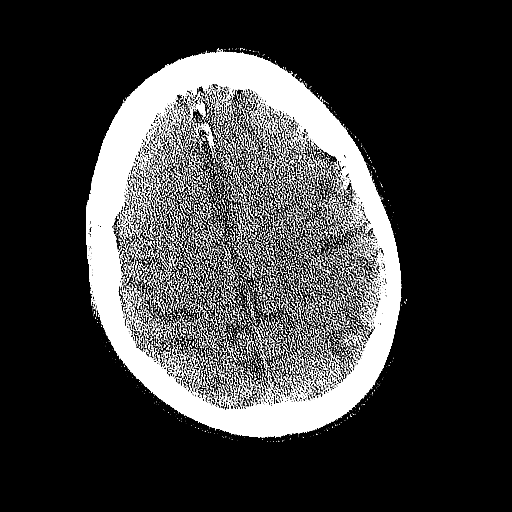
[im 61/75  brain]
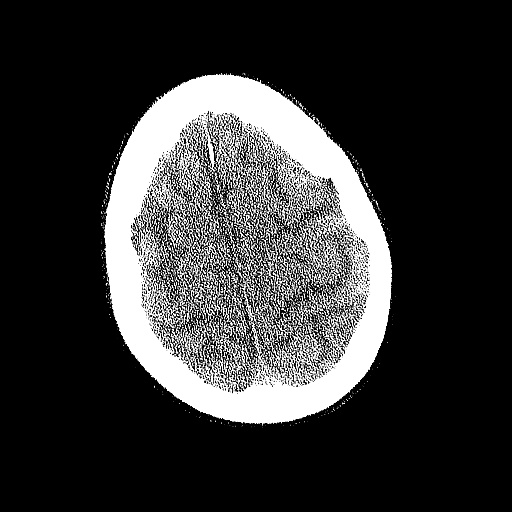
[im 61/75  bone]
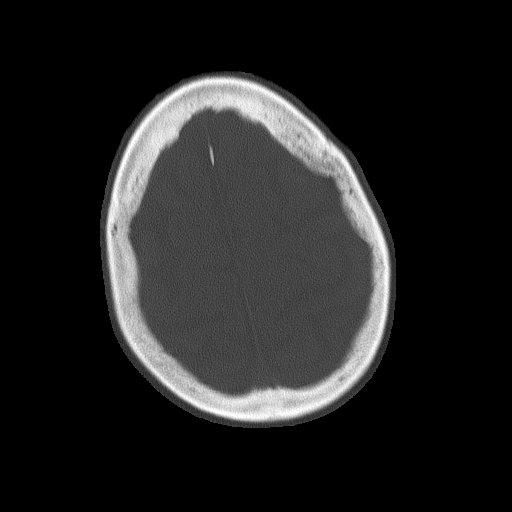
[im 70/75  brain]
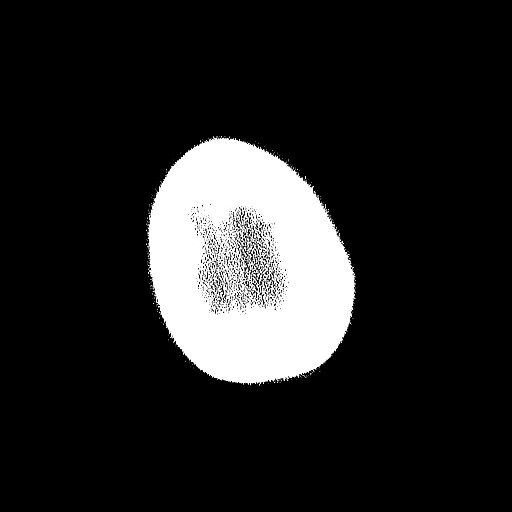

[Series 7: head 3.0 mpr cor · coronal · 0.34mm/px · 3 of 72 slices shown]
[im 18/72  brain]
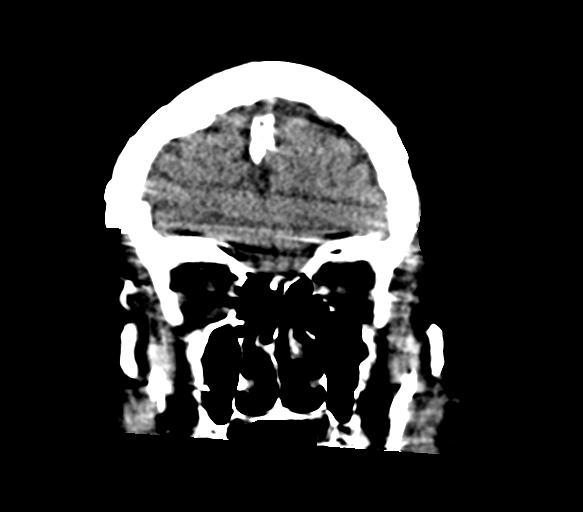
[im 36/72  brain]
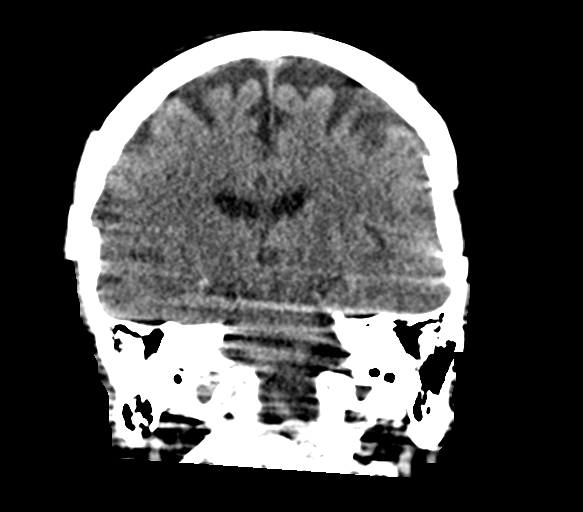
[im 54/72  brain]
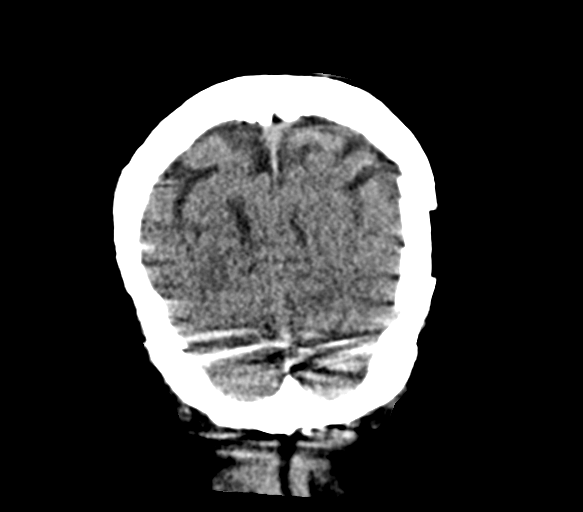

[Series 9: head 3.0 mpr sag · sagittal · 0.31mm/px · 2 of 65 slices shown]
[im 22/65  brain]
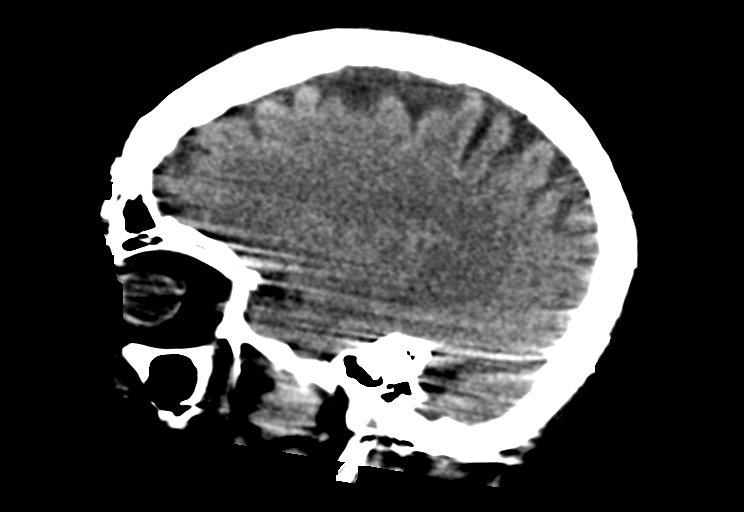
[im 43/65  brain]
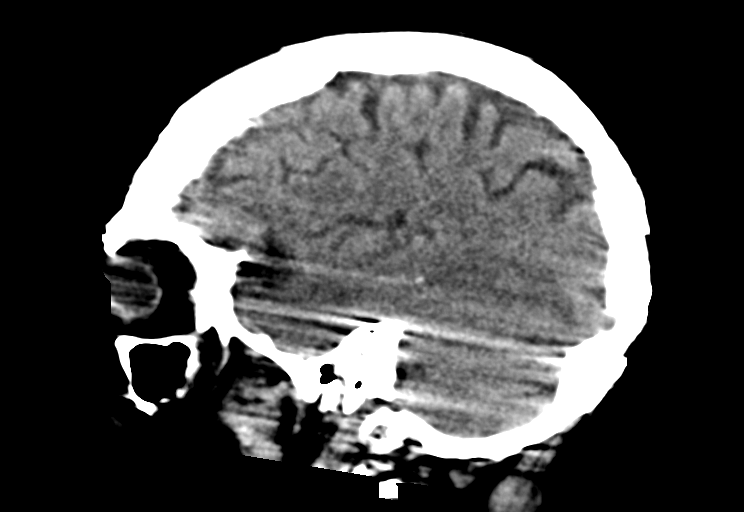

[15 of 47 positions shown; findings below may reference images not displayed]

FINDINGS: Due to patient's altered mental status, there is moderate motion
artifact despite a second imaging acquisition.

Brain: No acute large territory infarct, intracranial hemorrhage,
midline shift, or extra-axial fluid collection is identified. The
ventricles and sulci are within normal limits for age.

Vascular: Calcified atherosclerosis at the skull base.

Skull: No fracture or suspicious osseous lesion.

Sinuses/Orbits: Paranasal sinuses and mastoid air cells are clear.
Bilateral cataract extraction.

Other: None.
IMPRESSION: Motion degraded examination without evidence of acute intracranial
abnormality.
